# Patient Record
Sex: Male | Born: 1980 | Race: White | Hispanic: No | Smoking: Never smoker
Health system: Southern US, Community
[De-identification: ages and names within clinical notes are randomized; demographics above are authoritative.]

## PROBLEM LIST (undated history)

## (undated) DIAGNOSIS — I10 Essential (primary) hypertension: Secondary | ICD-10-CM

## (undated) DIAGNOSIS — I639 Cerebral infarction, unspecified: Secondary | ICD-10-CM

## (undated) DIAGNOSIS — G8929 Other chronic pain: Secondary | ICD-10-CM

## (undated) DIAGNOSIS — M549 Dorsalgia, unspecified: Secondary | ICD-10-CM

## (undated) DIAGNOSIS — M109 Gout, unspecified: Secondary | ICD-10-CM

## (undated) DIAGNOSIS — F419 Anxiety disorder, unspecified: Secondary | ICD-10-CM

## (undated) HISTORY — PX: TONSILLECTOMY: SUR1361

## (undated) HISTORY — DX: Anxiety disorder, unspecified: F41.9

---
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
## 2001-12-09 ENCOUNTER — Encounter: Payer: Self-pay | Admitting: Emergency Medicine

## 2001-12-09 ENCOUNTER — Emergency Department (HOSPITAL_COMMUNITY): Admission: EM | Admit: 2001-12-09 | Discharge: 2001-12-09 | Payer: Self-pay | Admitting: Emergency Medicine

## 2002-08-08 ENCOUNTER — Emergency Department (HOSPITAL_COMMUNITY): Admission: EM | Admit: 2002-08-08 | Discharge: 2002-08-08 | Payer: Self-pay | Admitting: Emergency Medicine

## 2002-11-18 ENCOUNTER — Emergency Department (HOSPITAL_COMMUNITY): Admission: EM | Admit: 2002-11-18 | Discharge: 2002-11-18 | Payer: Self-pay | Admitting: Emergency Medicine

## 2003-06-30 ENCOUNTER — Emergency Department (HOSPITAL_COMMUNITY): Admission: EM | Admit: 2003-06-30 | Discharge: 2003-06-30 | Payer: Self-pay | Admitting: Family Medicine

## 2003-07-31 ENCOUNTER — Emergency Department (HOSPITAL_COMMUNITY): Admission: EM | Admit: 2003-07-31 | Discharge: 2003-07-31 | Payer: Self-pay | Admitting: Emergency Medicine

## 2003-08-13 ENCOUNTER — Emergency Department (HOSPITAL_COMMUNITY): Admission: EM | Admit: 2003-08-13 | Discharge: 2003-08-13 | Payer: Self-pay | Admitting: Emergency Medicine

## 2003-10-07 ENCOUNTER — Emergency Department (HOSPITAL_COMMUNITY): Admission: EM | Admit: 2003-10-07 | Discharge: 2003-10-07 | Payer: Self-pay | Admitting: *Deleted

## 2004-02-15 ENCOUNTER — Emergency Department (HOSPITAL_COMMUNITY): Admission: EM | Admit: 2004-02-15 | Discharge: 2004-02-15 | Payer: Self-pay | Admitting: Emergency Medicine

## 2005-01-28 ENCOUNTER — Emergency Department (HOSPITAL_COMMUNITY): Admission: EM | Admit: 2005-01-28 | Discharge: 2005-01-28 | Payer: Self-pay | Admitting: Family Medicine

## 2005-07-15 ENCOUNTER — Emergency Department (HOSPITAL_COMMUNITY): Admission: EM | Admit: 2005-07-15 | Discharge: 2005-07-15 | Payer: Self-pay | Admitting: Emergency Medicine

## 2005-07-28 ENCOUNTER — Emergency Department (HOSPITAL_COMMUNITY): Admission: EM | Admit: 2005-07-28 | Discharge: 2005-07-28 | Payer: Self-pay | Admitting: Family Medicine

## 2006-03-03 ENCOUNTER — Emergency Department (HOSPITAL_COMMUNITY): Admission: EM | Admit: 2006-03-03 | Discharge: 2006-03-03 | Payer: Self-pay | Admitting: Emergency Medicine

## 2006-03-04 ENCOUNTER — Emergency Department (HOSPITAL_COMMUNITY): Admission: EM | Admit: 2006-03-04 | Discharge: 2006-03-04 | Payer: Self-pay | Admitting: Emergency Medicine

## 2006-06-07 ENCOUNTER — Emergency Department (HOSPITAL_COMMUNITY): Admission: EM | Admit: 2006-06-07 | Discharge: 2006-06-07 | Payer: Self-pay | Admitting: Family Medicine

## 2006-06-29 ENCOUNTER — Emergency Department (HOSPITAL_COMMUNITY): Admission: EM | Admit: 2006-06-29 | Discharge: 2006-06-29 | Payer: Self-pay | Admitting: Family Medicine

## 2006-07-30 ENCOUNTER — Emergency Department (HOSPITAL_COMMUNITY): Admission: EM | Admit: 2006-07-30 | Discharge: 2006-07-30 | Payer: Self-pay | Admitting: Emergency Medicine

## 2007-05-15 ENCOUNTER — Emergency Department (HOSPITAL_COMMUNITY): Admission: EM | Admit: 2007-05-15 | Discharge: 2007-05-16 | Payer: Self-pay | Admitting: Emergency Medicine

## 2008-08-17 ENCOUNTER — Emergency Department (HOSPITAL_BASED_OUTPATIENT_CLINIC_OR_DEPARTMENT_OTHER): Admission: EM | Admit: 2008-08-17 | Discharge: 2008-08-18 | Payer: Self-pay | Admitting: Emergency Medicine

## 2008-10-08 ENCOUNTER — Emergency Department (HOSPITAL_COMMUNITY): Admission: EM | Admit: 2008-10-08 | Discharge: 2008-10-08 | Payer: Self-pay | Admitting: Emergency Medicine

## 2008-12-30 ENCOUNTER — Ambulatory Visit: Payer: Self-pay | Admitting: Diagnostic Radiology

## 2008-12-30 ENCOUNTER — Emergency Department (HOSPITAL_BASED_OUTPATIENT_CLINIC_OR_DEPARTMENT_OTHER): Admission: EM | Admit: 2008-12-30 | Discharge: 2008-12-30 | Payer: Self-pay | Admitting: Emergency Medicine

## 2008-12-31 ENCOUNTER — Emergency Department (HOSPITAL_BASED_OUTPATIENT_CLINIC_OR_DEPARTMENT_OTHER): Admission: EM | Admit: 2008-12-31 | Discharge: 2008-12-31 | Payer: Self-pay | Admitting: Emergency Medicine

## 2009-04-03 ENCOUNTER — Other Ambulatory Visit: Payer: Self-pay | Admitting: Emergency Medicine

## 2009-04-04 ENCOUNTER — Other Ambulatory Visit: Payer: Self-pay | Admitting: Emergency Medicine

## 2009-04-04 ENCOUNTER — Inpatient Hospital Stay (HOSPITAL_COMMUNITY): Admission: AD | Admit: 2009-04-04 | Discharge: 2009-04-08 | Payer: Self-pay | Admitting: Psychiatry

## 2009-04-05 ENCOUNTER — Ambulatory Visit: Payer: Self-pay | Admitting: Psychiatry

## 2009-06-18 ENCOUNTER — Emergency Department (HOSPITAL_BASED_OUTPATIENT_CLINIC_OR_DEPARTMENT_OTHER): Admission: EM | Admit: 2009-06-18 | Discharge: 2009-06-19 | Payer: Self-pay | Admitting: Emergency Medicine

## 2009-06-19 ENCOUNTER — Ambulatory Visit: Payer: Self-pay | Admitting: Diagnostic Radiology

## 2009-07-28 ENCOUNTER — Emergency Department (HOSPITAL_BASED_OUTPATIENT_CLINIC_OR_DEPARTMENT_OTHER): Admission: EM | Admit: 2009-07-28 | Discharge: 2009-07-28 | Payer: Self-pay | Admitting: Emergency Medicine

## 2010-02-16 ENCOUNTER — Encounter: Admit: 2010-02-16 | Payer: Self-pay | Admitting: Family Medicine

## 2010-05-03 LAB — BASIC METABOLIC PANEL
CO2: 23 mEq/L (ref 19–32)
Calcium: 8.7 mg/dL (ref 8.4–10.5)
Chloride: 110 mEq/L (ref 96–112)
Glucose, Bld: 101 mg/dL — ABNORMAL HIGH (ref 70–99)
Potassium: 4.3 mEq/L (ref 3.5–5.1)
Sodium: 144 mEq/L (ref 135–145)

## 2010-05-05 LAB — DIFFERENTIAL
Eosinophils Absolute: 0.3 10*3/uL (ref 0.0–0.7)
Eosinophils Relative: 2 % (ref 0–5)
Lymphocytes Relative: 28 % (ref 12–46)
Lymphs Abs: 3.6 10*3/uL (ref 0.7–4.0)
Monocytes Relative: 6 % (ref 3–12)

## 2010-05-05 LAB — CBC
HCT: 45.3 % (ref 39.0–52.0)
Hemoglobin: 15.5 g/dL (ref 13.0–17.0)
MCV: 85.2 fL (ref 78.0–100.0)
RBC: 5.32 MIL/uL (ref 4.22–5.81)
WBC: 12.6 10*3/uL — ABNORMAL HIGH (ref 4.0–10.5)

## 2010-05-05 LAB — BASIC METABOLIC PANEL
Chloride: 103 mEq/L (ref 96–112)
GFR calc Af Amer: >60 mL/min (ref >60)
GFR calc non Af Amer: >60 mL/min (ref >60)
Potassium: 3.7 mEq/L (ref 3.5–5.1)
Sodium: 142 mEq/L (ref 135–145)

## 2010-05-05 LAB — POCT TOXICOLOGY PANEL: Benzodiazepines: POSITIVE

## 2010-05-05 LAB — ETHANOL: Alcohol, Ethyl (B): <5 mg/dL (ref 0–10)

## 2010-05-05 LAB — ACETAMINOPHEN LEVEL: Acetaminophen (Tylenol), Serum: <10 ug/mL — ABNORMAL LOW (ref 10–30)

## 2010-05-19 LAB — DIFFERENTIAL
Eosinophils Absolute: 0.1 10*3/uL (ref 0.0–0.7)
Eosinophils Relative: 1 % (ref 0–5)
Lymphocytes Relative: 12 % (ref 12–46)
Lymphs Abs: 1.6 10*3/uL (ref 0.7–4.0)
Monocytes Absolute: 0.9 10*3/uL (ref 0.1–1.0)
Monocytes Relative: 6 % (ref 3–12)

## 2010-05-19 LAB — URINE MICROSCOPIC-ADD ON

## 2010-05-19 LAB — URINALYSIS, ROUTINE W REFLEX MICROSCOPIC
Bilirubin Urine: NEGATIVE
Glucose, UA: NEGATIVE mg/dL
Ketones, ur: NEGATIVE mg/dL
Nitrite: NEGATIVE
Protein, ur: 30 mg/dL — AB
pH: 5.5 (ref 5.0–8.0)

## 2010-05-19 LAB — BASIC METABOLIC PANEL
Chloride: 106 mEq/L (ref 96–112)
GFR calc non Af Amer: >60 mL/min (ref >60)
Potassium: 4.1 mEq/L (ref 3.5–5.1)
Sodium: 145 mEq/L (ref 135–145)

## 2010-05-19 LAB — CBC
HCT: 44.8 % (ref 39.0–52.0)
Hemoglobin: 15.4 g/dL (ref 13.0–17.0)
MCV: 84.8 fL (ref 78.0–100.0)
WBC: 13.7 10*3/uL — ABNORMAL HIGH (ref 4.0–10.5)

## 2010-11-08 LAB — RPR: RPR Ser Ql: NONREACTIVE

## 2010-11-08 LAB — URINE MICROSCOPIC-ADD ON

## 2010-11-08 LAB — URINALYSIS, ROUTINE W REFLEX MICROSCOPIC
Glucose, UA: NEGATIVE
Nitrite: NEGATIVE
Specific Gravity, Urine: 1.034 — ABNORMAL HIGH
pH: 5

## 2011-10-22 ENCOUNTER — Encounter (HOSPITAL_COMMUNITY): Payer: Self-pay | Admitting: Emergency Medicine

## 2011-10-22 ENCOUNTER — Emergency Department (HOSPITAL_COMMUNITY)
Admission: EM | Admit: 2011-10-22 | Discharge: 2011-10-22 | Disposition: A | Discharge status: Home / Self Care | Payer: Medicaid Other | Attending: Emergency Medicine | Admitting: Emergency Medicine

## 2011-10-22 DIAGNOSIS — J069 Acute upper respiratory infection, unspecified: Secondary | ICD-10-CM | POA: Insufficient documentation

## 2011-10-22 DIAGNOSIS — I10 Essential (primary) hypertension: Secondary | ICD-10-CM | POA: Insufficient documentation

## 2011-10-22 HISTORY — DX: Essential (primary) hypertension: I10

## 2011-10-22 MED ORDER — NAPROXEN 375 MG PO TABS: 375.0000 mg | ORAL_TABLET | Freq: Two times a day (BID) | ORAL | Status: AC | PRN

## 2011-10-22 MED ORDER — FEXOFENADINE-PSEUDOEPHED ER 60-120 MG PO TB12: 1.0000 | ORAL_TABLET | Freq: Two times a day (BID) | ORAL | Status: AC | PRN

## 2011-10-22 NOTE — ED Provider Notes (Signed)
History    31yM with ear pain. B/l with R worse than L. Onset almost a week ago. Feels like getting worse. No fever or chills. Mild sore throat. No difficulty breathing or swallowing. Mild HA and facial pressure. No CP or SOB. No cough or wheezing. Has not tired taking anything for symptoms.   CSN: [id number]  Arrival date & time 10/22/11  1417   First MD Initiated Contact with Patient 10/22/11 1551      Chief Complaint  Patient presents with  . Otalgia    (Consider location/radiation/quality/duration/timing/severity/associated sxs/prior treatment) HPI  Past Medical History  Diagnosis Date  . Hypertension     Past Surgical History  Procedure Date  . Tonsillectomy     No family history on file.  History  Substance Use Topics  . Smoking status: Never Smoker   . Smokeless tobacco: Never Used  . Alcohol Use: No      Review of Systems   Review of symptoms negative unless otherwise noted in HPI.   Allergies  Review of patient's allergies indicates no known allergies.  Home Medications  No current outpatient prescriptions on file.  BP 161/97  Pulse 93  Temp 98.1 F (36.7 C) (Oral)  Resp 24  SpO2 100%  Physical Exam  Nursing note and vitals reviewed. Constitutional: He appears well-developed and well-nourished. No distress.  HENT:  Head: Normocephalic and atraumatic.  Right Ear: External ear normal.  Left Ear: External ear normal.  Mouth/Throat: Oropharynx is clear and moist. No oropharyngeal exudate.       Tympanic membranes are clear no bulging. No effusion noted. External auditory canals are clear as well. There is no mastoid tenderness. No overlying skin changes. Posterior pharynx is clear. Uvula is midline. Handling secretions. No stridor.  Eyes: Conjunctivae are normal. Pupils are equal, round, and reactive to light. Right eye exhibits no discharge. Left eye exhibits no discharge.  Neck: Normal range of motion. Neck supple.  Cardiovascular: Normal  rate, regular rhythm and normal heart sounds.  Exam reveals no gallop and no friction rub.   No murmur heard. Pulmonary/Chest: Effort normal and breath sounds normal. No stridor. No respiratory distress.  Abdominal: Soft. He exhibits no distension. There is no tenderness.  Musculoskeletal: He exhibits no edema and no tenderness.  Lymphadenopathy:    He has no cervical adenopathy.  Neurological: He is alert.  Skin: Skin is warm and dry.  Psychiatric: He has a normal mood and affect. His behavior is normal. Thought content normal.    ED Course  Procedures (including critical care time)  Labs Reviewed - No data to display No results found.   1. Viral URI       MDM  31yM with b/l ear pain, headache and congestion. Physical exam fairly unremarkable. No evidence of serious bacterial illness. Plan symptomatic tx for viral uri or possible sinusitis. No indication for abx. Return precautions discussed. Outpt fu.        Raeford Razor, MD 10/22/11 1610

## 2011-10-22 NOTE — ED Notes (Signed)
Bilateral ear pain onset several days ago, headache onset today. Pt continues to list a medley list of other Sx from other body pain

## 2011-10-25 ENCOUNTER — Emergency Department (HOSPITAL_BASED_OUTPATIENT_CLINIC_OR_DEPARTMENT_OTHER)
Admission: EM | Admit: 2011-10-25 | Discharge: 2011-10-25 | Disposition: A | Discharge status: Home / Self Care | Payer: Medicaid Other | Attending: Emergency Medicine | Admitting: Emergency Medicine

## 2011-10-25 ENCOUNTER — Emergency Department (HOSPITAL_BASED_OUTPATIENT_CLINIC_OR_DEPARTMENT_OTHER): Payer: Medicaid Other

## 2011-10-25 ENCOUNTER — Encounter (HOSPITAL_BASED_OUTPATIENT_CLINIC_OR_DEPARTMENT_OTHER): Payer: Self-pay | Admitting: *Deleted

## 2011-10-25 DIAGNOSIS — S6710XA Crushing injury of unspecified finger(s), initial encounter: Secondary | ICD-10-CM

## 2011-10-25 DIAGNOSIS — I10 Essential (primary) hypertension: Secondary | ICD-10-CM | POA: Insufficient documentation

## 2011-10-25 DIAGNOSIS — W230XXA Caught, crushed, jammed, or pinched between moving objects, initial encounter: Secondary | ICD-10-CM | POA: Insufficient documentation

## 2011-10-25 DIAGNOSIS — S62639A Displaced fracture of distal phalanx of unspecified finger, initial encounter for closed fracture: Secondary | ICD-10-CM

## 2011-10-25 MED ORDER — CEPHALEXIN 500 MG PO CAPS: 500.0000 mg | ORAL_CAPSULE | Freq: Three times a day (TID) | ORAL | Status: AC

## 2011-10-25 MED ORDER — HYDROCODONE-ACETAMINOPHEN 5-325 MG PO TABS
2.0000 | ORAL_TABLET | Freq: Once | ORAL | Status: AC
  Administered 2011-10-25: 2 via ORAL
  Filled 2011-10-25: qty 2

## 2011-10-25 MED ORDER — HYDROCODONE-ACETAMINOPHEN 5-325 MG PO TABS: ORAL_TABLET | ORAL | Status: AC

## 2011-10-25 NOTE — ED Notes (Signed)
Pt c/o right index and 3 rd finger injury x 1 day ago

## 2011-10-25 NOTE — ED Provider Notes (Signed)
History     CSN: [id number]  Arrival date & time 10/25/11  0006   First MD Initiated Contact with Patient 10/25/11 0210      Chief Complaint  Patient presents with  . Finger Injury    (Consider location/radiation/quality/duration/timing/severity/associated sxs/prior treatment) HPI Comments: Pt reports got hand injured in a fan that had its guard open.  Injured tips of third and second fingers, but bleeding from nail of third finger.  Difficulty bending at tip due to pain and swelling.  No numbness or weakness.  Right handed.  No fevers.  Bleeding has stopped.    Patient is a 31 y.o. male presenting with hand pain. The history is provided by the patient and a relative.  Hand Pain The current episode started 12 to 24 hours ago. The problem occurs constantly. The problem has been gradually worsening. The symptoms are aggravated by bending. Nothing relieves the symptoms. He has tried nothing for the symptoms.    Past Medical History  Diagnosis Date  . Hypertension     Past Surgical History  Procedure Date  . Tonsillectomy     History reviewed. No pertinent family history.  History  Substance Use Topics  . Smoking status: Never Smoker   . Smokeless tobacco: Never Used  . Alcohol Use: No      Review of Systems  Constitutional: Negative.   Musculoskeletal: Positive for joint swelling and arthralgias.  Skin: Positive for wound. Negative for color change and pallor.  Neurological: Negative for weakness and numbness.  Hematological: Does not bruise/bleed easily.    Allergies  Review of patient's allergies indicates no known allergies.  Home Medications   Current Outpatient Rx  Name Route Sig Dispense Refill  . CEPHALEXIN 500 MG PO CAPS Oral Take 1 capsule (500 mg total) by mouth 3 (three) times daily. 30 capsule 0  . FEXOFENADINE-PSEUDOEPHED ER 60-120 MG PO TB12 Oral Take 1 tablet by mouth 2 (two) times daily as needed. 20 tablet 0  . HYDROCODONE-ACETAMINOPHEN 5-325  MG PO TABS  1-2 tablets po q 6 hours prn moderate to severe pain 20 tablet 0  . NAPROXEN 375 MG PO TABS Oral Take 1 tablet (375 mg total) by mouth 2 (two) times daily as needed. 20 tablet 0    BP 128/89  Pulse 78  Temp 99 F (37.2 C) (Oral)  Resp 16  Ht 5\' 7"  (1.702 m)  Wt 360 lb (163.295 kg)  BMI 56.38 kg/m2  SpO2 100%  Physical Exam  Nursing note and vitals reviewed. Constitutional: He appears well-developed and well-nourished.  HENT:  Head: Normocephalic and atraumatic.  Cardiovascular: Intact distal pulses.   Musculoskeletal:       Right hand: He exhibits decreased range of motion and tenderness.       Hands:      Third finger, pt cannot flex at distal PIP on right  Neurological: He is alert. He has normal strength. Coordination normal.  Skin: Skin is warm and dry. No rash noted. No pallor.  Psychiatric: He has a normal mood and affect.    ED Course  Procedures (including critical care time)  Labs Reviewed - No data to display Dg Hand Complete Right  10/25/2011  *RADIOLOGY REPORT*  Clinical Data: Pain, bruising, and bleeding of the right middle finger after injury.  RIGHT HAND - COMPLETE 3+ VIEW  Comparison: None.  Findings: Crush type fracture of the distal phalangeal tuft of the right third finger with mild displacement fracture fragment dorsally.  Soft  tissue swelling.  No soft tissue gas demonstrated. Bones appear otherwise intact.  IMPRESSION: Fracture the distal phalangeal tuft of the right third finger.   Original Report Authenticated By: Marlon Pel, M.D.      1. Phalanx, distal fracture of finger   2. Crush injury to finger       MDM  Pt with bleeding from beneath fingernail, some limitation in flexion of distal phalanx due to pain and injury.  Cleaned, put on abx, finger splint, refer to hand surgeon.  Mild contusion to second finger, but FROM.          Gavin Pound. Oletta Lamas, MD 10/25/11 1610

## 2011-10-25 NOTE — Discharge Instructions (Signed)
 Crush Injury, Fingers or Toes A crush injury to the fingers or toes means the tissues have been damaged by being squeezed (compressed). There will be bleeding into the tissues and swelling. Often, blood will collect under the skin. When this happens, the skin on the finger often dies and may slough off (shed) 1 week to 10 days later. Usually, new skin is growing underneath. If the injury has been too severe and the tissue does not survive, the damaged tissue may begin to turn black over several days.  Wounds which occur because of the crushing may be stitched (sutured) shut. However, crush injuries are more likely to become infected than other injuries.These wounds may not be closed as tightly as other types of cuts to prevent infection. Nails involved are often lost. These usually grow back over several weeks.  DIAGNOSIS X-rays may be taken to see if there is any injury to the bones. TREATMENT Broken bones (fractures) may be treated with splinting, depending on the fracture. Often, no treatment is required for fractures of the last bone in the fingers or toes. HOME CARE INSTRUCTIONS   The crushed part should be raised (elevated) above the heart or center of the chest as much as possible for the first several days or as directed. This helps with pain and lessens swelling. Less swelling increases the chances that the crushed part will survive.   Put ice on the injured area.   Put ice in a plastic bag.   Place a towel between your skin and the bag.   Leave the ice on for 15 to 20 minutes, 3 to 4 times a day for the first 2 days.   Only take over-the-counter or prescription medicines for pain, discomfort, or fever as directed by your caregiver.   Use your injured part only as directed.   Change your bandages (dressings) as directed.   Keep all follow-up appointments as directed by your caregiver. Not keeping your appointment could result in a chronic or permanent injury, pain, and disability.  If there is any problem keeping the appointment, you must call to reschedule.  SEEK IMMEDIATE MEDICAL CARE IF:   There is redness, swelling, or increasing pain in the wound area.   Pus is coming from the wound.   You have a fever.   You notice a bad smell coming from the wound or dressing.   The edges of the wound do not stay together after the sutures have been removed.   You are unable to move the injured finger or toe.  MAKE SURE YOU:   Understand these instructions.   Will watch your condition.   Will get help right away if you are not doing well or get worse.  Document Released: 01/31/2005 Document Revised: 01/20/2011 Document Reviewed: 06/18/2010 [facility] Patient Information 2012 [place], [place].    Narcotic and benzodiazepine use may cause drowsiness, slowed breathing or dependence.  Please use with caution and do not drive, operate machinery or watch young children alone while taking them.  Taking combinations of these medications or drinking alcohol will potentiate these effects.

## 2011-10-25 NOTE — ED Notes (Addendum)
MD at bedside. 

## 2012-05-13 ENCOUNTER — Emergency Department (HOSPITAL_BASED_OUTPATIENT_CLINIC_OR_DEPARTMENT_OTHER)
Admission: EM | Admit: 2012-05-13 | Discharge: 2012-05-13 | Disposition: A | Discharge status: Home / Self Care | Payer: Medicaid Other | Attending: Emergency Medicine | Admitting: Emergency Medicine

## 2012-05-13 ENCOUNTER — Emergency Department (HOSPITAL_BASED_OUTPATIENT_CLINIC_OR_DEPARTMENT_OTHER): Payer: Medicaid Other

## 2012-05-13 ENCOUNTER — Encounter (HOSPITAL_BASED_OUTPATIENT_CLINIC_OR_DEPARTMENT_OTHER): Payer: Self-pay | Admitting: *Deleted

## 2012-05-13 DIAGNOSIS — Z862 Personal history of diseases of the blood and blood-forming organs and certain disorders involving the immune mechanism: Secondary | ICD-10-CM | POA: Insufficient documentation

## 2012-05-13 DIAGNOSIS — Z8739 Personal history of other diseases of the musculoskeletal system and connective tissue: Secondary | ICD-10-CM

## 2012-05-13 DIAGNOSIS — I10 Essential (primary) hypertension: Secondary | ICD-10-CM | POA: Insufficient documentation

## 2012-05-13 DIAGNOSIS — M25571 Pain in right ankle and joints of right foot: Secondary | ICD-10-CM

## 2012-05-13 DIAGNOSIS — S82891A Other fracture of right lower leg, initial encounter for closed fracture: Secondary | ICD-10-CM

## 2012-05-13 DIAGNOSIS — S92109A Unspecified fracture of unspecified talus, initial encounter for closed fracture: Secondary | ICD-10-CM | POA: Insufficient documentation

## 2012-05-13 DIAGNOSIS — W010XXA Fall on same level from slipping, tripping and stumbling without subsequent striking against object, initial encounter: Secondary | ICD-10-CM | POA: Insufficient documentation

## 2012-05-13 DIAGNOSIS — Y92009 Unspecified place in unspecified non-institutional (private) residence as the place of occurrence of the external cause: Secondary | ICD-10-CM | POA: Insufficient documentation

## 2012-05-13 DIAGNOSIS — Z8639 Personal history of other endocrine, nutritional and metabolic disease: Secondary | ICD-10-CM | POA: Insufficient documentation

## 2012-05-13 DIAGNOSIS — Y9389 Activity, other specified: Secondary | ICD-10-CM | POA: Insufficient documentation

## 2012-05-13 MED ORDER — HYDROCODONE-ACETAMINOPHEN 5-325 MG PO TABS
1.0000 | ORAL_TABLET | Freq: Once | ORAL | Status: AC
  Administered 2012-05-13: 1 via ORAL
  Filled 2012-05-13: qty 1

## 2012-05-13 MED ORDER — HYDROCODONE-ACETAMINOPHEN 5-325 MG PO TABS: 1.0000 | ORAL_TABLET | ORAL | Status: DC | PRN

## 2012-05-13 NOTE — ED Notes (Signed)
Patient transported to X-ray 

## 2012-05-13 NOTE — ED Provider Notes (Signed)
History     CSN: [id number]  Arrival date & time 05/13/12  0272   First MD Initiated Contact with Patient 05/13/12 1850      Chief Complaint  Patient presents with  . Ankle Injury    (Consider location/radiation/quality/duration/timing/severity/associated sxs/prior treatment) HPI Comments: Patient is a 32 y/o M with PMHx gout presenting to the ED with right ankle pain. Patient reported that pain started yesterday shortly after tripping over brushes in front of a yard, patient reported that when he landed he landed on his right foot in an inverted manner. Patient reported that right ankle pain has become increasingly worse, patient is unable to specify pain - reported that pain is severe. Patient reported radiation to the right foot, but no radiation to right leg. Pain is worse with pressure and weight bearing. Patient reported using heat with little relief. Patient reported moderate swelling with no sign of ecchymosis. Patient denied numbness, tingling, inflammation, bruising, lesions, changes to vision, headaches, dizziness, abdominal pain, neck pain, shortness of breathe, difficulty breathing, chest pain, fever, nausea, vomiting.  Patient is a 32 y.o. male presenting with ankle pain. The history is provided by the patient. No language interpreter was used.  Ankle Pain Location:  Ankle Time since incident:  1 day Injury: yes   Ankle location:  R ankle Pain details:    Quality:  Unable to specify (patient reported that it is just painful.)   Radiates to: Right foot.   Severity:  Severe   Onset quality:  Sudden   Duration:  1 day   Timing:  Constant   Progression:  Worsening Chronicity:  New Dislocation: no   Foreign body present:  No foreign bodies Prior injury to area:  No Relieved by:  Nothing Worsened by:  Bearing weight and activity Ineffective treatments:  Heat and rest Associated symptoms: decreased ROM and swelling   Associated symptoms: no back pain, no fatigue, no  fever, no muscle weakness, no neck pain, no numbness and no tingling   Risk factors: obesity   Risk factors: no recent illness     Past Medical History  Diagnosis Date  . Hypertension     Past Surgical History  Procedure Laterality Date  . Tonsillectomy      History reviewed. No pertinent family history.  History  Substance Use Topics  . Smoking status: Never Smoker   . Smokeless tobacco: Never Used  . Alcohol Use: No      Review of Systems  Constitutional: Negative for fever, chills and fatigue.  HENT: Negative for ear pain and neck pain.   Eyes: Negative for pain.  Respiratory: Negative for chest tightness and shortness of breath.   Cardiovascular: Negative for chest pain and leg swelling.  Genitourinary: Negative for dysuria.  Musculoskeletal: Positive for joint swelling and arthralgias. Negative for back pain.       Right ankle pain - due to injury.  All other systems reviewed and are negative.    Allergies  Review of patient's allergies indicates no known allergies.  Home Medications   Current Outpatient Rx  Name  Route  Sig  Dispense  Refill  . fexofenadine-pseudoephedrine (ALLEGRA-D) 60-120 MG per tablet   Oral   Take 1 tablet by mouth 2 (two) times daily as needed.   20 tablet   0   . HYDROcodone-acetaminophen (NORCO) 5-325 MG per tablet   Oral   Take 1 tablet by mouth every 4 (four) hours as needed for pain.   6 tablet  0   . naproxen (NAPROSYN) 375 MG tablet   Oral   Take 1 tablet (375 mg total) by mouth 2 (two) times daily as needed.   20 tablet   0     BP 146/96  Pulse 82  Temp(Src) 98.4 F (36.9 C) (Oral)  Resp 20  Ht 5\' 9"  (1.753 m)  Wt 395 lb (179.171 kg)  BMI 58.3 kg/m2  SpO2 98%  Physical Exam  Nursing note and vitals reviewed. Constitutional: He is oriented to person, place, and time. He appears well-developed and well-nourished. No distress.  HENT:  Head: Normocephalic and atraumatic.  Eyes: Conjunctivae and EOM are  normal. Pupils are equal, round, and reactive to light. Right eye exhibits no discharge. Left eye exhibits no discharge.  Neck: Normal range of motion. Neck supple. No thyromegaly present.  Cardiovascular: Normal rate, regular rhythm, normal heart sounds and intact distal pulses.  Exam reveals no friction rub.   No murmur heard. Pulmonary/Chest: Effort normal and breath sounds normal. No respiratory distress. He has no wheezes. He has no rales.  Musculoskeletal: He exhibits edema and tenderness.  Right ankle: Moderate swelling to lateral aspect of right ankle. Pain upon palpation to right ankle. Decreased ROM due to pain. Pain upon attempt of inversion, eversion, dorsi-flexion, plantar flexion- patient unable to move ankle. Full ROM in toes. Sensation intact. Pulses palpable.   Right knee full ROM.   Left lower extremity: Full ROM, sensation intact, pulses palpable.   Lymphadenopathy:    He has no cervical adenopathy.  Neurological: He is alert and oriented to person, place, and time. No cranial nerve deficit. He exhibits normal muscle tone. Coordination normal.  Skin: Skin is warm and dry. No rash noted. He is not diaphoretic. No erythema.    ED Course  Procedures (including critical care time)  Labs Reviewed - No data to display Dg Ankle Complete Right  05/13/2012  *RADIOLOGY REPORT*  Clinical Data: Injury  RIGHT ANKLE - COMPLETE 3+ VIEW  Comparison: None.  Findings: Soft tissue swelling about the ankle is moderate. Minimal spurring at the posterior calcaneus. Linear lucency through the posterior aspect of the talus worrisome for avulsion fracture is noted to  IMPRESSION: Tiny avulsion fracture the posterior talus of indeterminate age.   Original Report Authenticated By: Jolaine Click, M.D.      1. Avulsion fracture of ankle, right, closed, initial encounter   2. Ankle pain, right   3. History of gout       MDM  DDx: Fracture Sprain Strain  I personally evaluated and examined  the patient. Right ankle xray identified avulsion fracture of the posterior talus. Patient afebrile, normotensive, non-tachycardic, alert and calm affect. Patient aseptic and non-toxic appearing. No neurovascular damage noted. Patient unable to ambulate due to pain with weight bearing. Patient placed in cam walker boot and given crutches. Discharged patient with pain medications - discussed with patient that while on pain medications to not drive, drink alcohol, use drugs, or operate any heavy machinery. Patient was instructed to elevate ankle, prop up with pillows, and ice as often as possible. Discussed with patient the importance of keeping on cam walker boot and using crutches. Discussed with patient to follow-up with Dr. Sherlean Foot, orthopedics. Discussed with patient that if symptoms are to worsen to report back to the ED. Patient agreed to plan of care, understood, all questions answered.         Raymon Mutton, PA-C 05/13/12 2338

## 2012-05-13 NOTE — ED Notes (Signed)
Pt states he injured his right ankle last p.m. PMS intact

## 2012-05-15 NOTE — ED Provider Notes (Signed)
Medical screening examination/treatment/procedure(s) were performed by non-physician practitioner and as supervising physician I was immediately available for consultation/collaboration.  Gilda Crease, MD 05/15/12 [phone]

## 2012-05-17 DIAGNOSIS — M25579 Pain in unspecified ankle and joints of unspecified foot: Secondary | ICD-10-CM | POA: Insufficient documentation

## 2012-06-04 ENCOUNTER — Ambulatory Visit: Payer: Medicaid Other | Attending: Orthopedic Surgery | Admitting: Physical Therapy

## 2012-07-23 ENCOUNTER — Ambulatory Visit: Payer: Medicaid Other | Admitting: *Deleted

## 2013-03-18 ENCOUNTER — Emergency Department (HOSPITAL_COMMUNITY)
Admission: EM | Admit: 2013-03-18 | Discharge: 2013-03-18 | Disposition: A | Discharge status: Home / Self Care | Payer: No Typology Code available for payment source | Attending: Emergency Medicine | Admitting: Emergency Medicine

## 2013-03-18 ENCOUNTER — Encounter (HOSPITAL_COMMUNITY): Payer: Self-pay | Admitting: Emergency Medicine

## 2013-03-18 ENCOUNTER — Emergency Department (HOSPITAL_COMMUNITY): Payer: No Typology Code available for payment source

## 2013-03-18 DIAGNOSIS — Y92410 Unspecified street and highway as the place of occurrence of the external cause: Secondary | ICD-10-CM | POA: Insufficient documentation

## 2013-03-18 DIAGNOSIS — S139XXA Sprain of joints and ligaments of unspecified parts of neck, initial encounter: Secondary | ICD-10-CM | POA: Insufficient documentation

## 2013-03-18 DIAGNOSIS — Y9389 Activity, other specified: Secondary | ICD-10-CM | POA: Insufficient documentation

## 2013-03-18 DIAGNOSIS — I10 Essential (primary) hypertension: Secondary | ICD-10-CM | POA: Insufficient documentation

## 2013-03-18 DIAGNOSIS — S161XXA Strain of muscle, fascia and tendon at neck level, initial encounter: Secondary | ICD-10-CM

## 2013-03-18 DIAGNOSIS — Z79899 Other long term (current) drug therapy: Secondary | ICD-10-CM | POA: Insufficient documentation

## 2013-03-18 MED ORDER — OXYCODONE-ACETAMINOPHEN 5-325 MG PO TABS: 1.0000 | ORAL_TABLET | ORAL | Status: DC | PRN

## 2013-03-18 MED ORDER — OXYCODONE-ACETAMINOPHEN 5-325 MG PO TABS
2.0000 | ORAL_TABLET | Freq: Once | ORAL | Status: AC
  Administered 2013-03-18: 2 via ORAL
  Filled 2013-03-18: qty 2

## 2013-03-18 MED ORDER — METHOCARBAMOL 750 MG PO TABS: 750.0000 mg | ORAL_TABLET | Freq: Four times a day (QID) | ORAL | Status: DC

## 2013-03-18 MED ORDER — DIAZEPAM 5 MG PO TABS
5.0000 mg | ORAL_TABLET | Freq: Once | ORAL | Status: AC
  Administered 2013-03-18: 5 mg via ORAL
  Filled 2013-03-18 (×2): qty 1
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
## 2014-01-02 ENCOUNTER — Emergency Department (HOSPITAL_BASED_OUTPATIENT_CLINIC_OR_DEPARTMENT_OTHER)
Admission: EM | Admit: 2014-01-02 | Discharge: 2014-01-02 | Disposition: A | Discharge status: Home / Self Care | Payer: Medicaid Other | Attending: Emergency Medicine | Admitting: Emergency Medicine

## 2014-01-02 ENCOUNTER — Encounter (HOSPITAL_BASED_OUTPATIENT_CLINIC_OR_DEPARTMENT_OTHER): Payer: Self-pay

## 2014-01-02 DIAGNOSIS — I10 Essential (primary) hypertension: Secondary | ICD-10-CM | POA: Insufficient documentation

## 2014-01-02 DIAGNOSIS — G8929 Other chronic pain: Secondary | ICD-10-CM | POA: Insufficient documentation

## 2014-01-02 DIAGNOSIS — M722 Plantar fascial fibromatosis: Secondary | ICD-10-CM | POA: Diagnosis not present

## 2014-01-02 DIAGNOSIS — E669 Obesity, unspecified: Secondary | ICD-10-CM | POA: Diagnosis not present

## 2014-01-02 DIAGNOSIS — Z79899 Other long term (current) drug therapy: Secondary | ICD-10-CM | POA: Insufficient documentation

## 2014-01-02 DIAGNOSIS — M79672 Pain in left foot: Secondary | ICD-10-CM | POA: Diagnosis present

## 2014-01-02 DIAGNOSIS — M7662 Achilles tendinitis, left leg: Secondary | ICD-10-CM | POA: Insufficient documentation

## 2014-01-02 HISTORY — DX: Other chronic pain: G89.29

## 2014-01-02 HISTORY — DX: Dorsalgia, unspecified: M54.9

## 2014-01-02 HISTORY — DX: Gout, unspecified: M10.9

## 2014-01-02 MED ORDER — OXYCODONE-ACETAMINOPHEN 5-325 MG PO TABS
ORAL_TABLET | ORAL | Status: AC
  Filled 2014-01-02: qty 1

## 2014-01-02 MED ORDER — NAPROXEN 500 MG PO TABS: 500.0000 mg | ORAL_TABLET | Freq: Two times a day (BID) | ORAL | Status: DC

## 2014-01-02 MED ORDER — OXYCODONE-ACETAMINOPHEN 5-325 MG PO TABS
1.0000 | ORAL_TABLET | Freq: Once | ORAL | Status: AC
  Administered 2014-01-02: 1 via ORAL

## 2014-01-02 NOTE — ED Provider Notes (Signed)
CSN: [id number]     Arrival date & time 01/02/14  0123 History   First MD Initiated Contact with Patient 01/02/14 0136     Chief Complaint  Patient presents with  . Foot Pain     (Consider location/radiation/quality/duration/timing/severity/associated sxs/prior Treatment) HPI  This is a 33 year old male with a history of hypertension and gout who presents with left foot pain. Patient reports chronic left foot pain. He has seen his primary doctor and has been diagnosed with Achilles tendinitis. Patient reports that the pain is getting worse. He states that it comes and goes. He states that it flares up approximately every 2 weeks. The pain is over his heel and improves as the day goes on. He is taking Aleve and Percocet at home which does help some.  Current pain is 6 out of 10. He states that sometimes he has to use crutches to walk.  Patient reports that he fell yesterday but does not think he reinjured his foot. He did not hit his head or lose consciousness. Denies history of diabetes.  Past Medical History  Diagnosis Date  . Hypertension   . Chronic back pain   . Gout    Past Surgical History  Procedure Laterality Date  . Tonsillectomy     No family history on file. History  Substance Use Topics  . Smoking status: Never Smoker   . Smokeless tobacco: Never Used  . Alcohol Use: No    Review of Systems  Constitutional: Negative.  Negative for fever.  Respiratory: Negative.   Cardiovascular: Negative.  Negative for chest pain.  Musculoskeletal: Negative for back pain.       Left foot pain  Skin: Negative for wound.  All other systems reviewed and are negative.     Allergies  Review of patient's allergies indicates no known allergies.  Home Medications   Prior to Admission medications   Medication Sig Start Date End Date Taking? Authorizing Provider  methocarbamol (ROBAXIN-750) 750 MG tablet Take 1 tablet (750 mg total) by mouth 4 (four) times daily. 03/18/13   Toy BakerAnthony  T Allen, MD  naproxen (NAPROSYN) 500 MG tablet Take 1 tablet (500 mg total) by mouth 2 (two) times daily. 01/02/14   Shon Batonourtney F Manav Pierotti, MD  oxyCODONE-acetaminophen (PERCOCET/ROXICET) 5-325 MG per tablet Take 1 tablet by mouth every 8 (eight) hours as needed for severe pain.    Historical Provider, MD  phentermine 37.5 MG capsule Take 37.5 mg by mouth 4 (four) times daily.    Historical Provider, MD   BP 150/83 mmHg  Pulse 91  Temp(Src) 98.2 F (36.8 C) (Oral)  Resp 20  Ht 5\' 9"  (1.753 m)  Wt 392 lb (177.81 kg)  BMI 57.86 kg/m2  SpO2 97% Physical Exam  Constitutional: He is oriented to person, place, and time.  Obese  HENT:  Head: Normocephalic and atraumatic.  Cardiovascular: Normal rate and regular rhythm.   Pulmonary/Chest: Effort normal. No respiratory distress.  Musculoskeletal: He exhibits no edema.  Focused examination of the left foot reveals tenderness to palpation over the plantar fascia and left heel, no sores or overlying skin changes noted, good range of motion at the ankle, no obvious deformity, 2+ DP pulse, patient ambulatory and able to bear weight.  Neurological: He is alert and oriented to person, place, and time.  Skin: Skin is warm and dry.  Psychiatric: He has a normal mood and affect.  Nursing note and vitals reviewed.   ED Course  Procedures (including critical care time)  Labs Review Labs Reviewed - No data to display  Imaging Review No results found.   EKG Interpretation None      MDM   Final diagnoses:  Plantar fasciitis of left foot  Achilles tendinitis of left lower extremity    Patient presents with chronic left foot and heel pain. Nontoxic on exam. No wounds or skin changes noted on exam. Given the pain improves as the day goes on and he has tenderness over his plantar fascia, suspect some element of plantar fasciitis.  Discussed with patient continuing anti-inflammatories and pain medication. He was instructed to stretch his plantar fascia  in the more to help with pain. Given chronicity of pain, will give him follow-up with sports medicine, Dr. Pearletha ForgeHudnall. Patient stated understanding. At this time and do not feel there is any indication for imaging. Patient is ambulatory without difficulty at discharge.  After history, exam, and medical workup I feel the patient has been appropriately medically screened and is safe for discharge home. Pertinent diagnoses were discussed with the patient. Patient was given return precautions.     Shon Batonourtney F Abdulkareem Badolato, MD 01/02/14 [phone]

## 2014-01-02 NOTE — ED Notes (Signed)
Pt reports been having left foot pain for "awhile now".  Reports was told in the past he has something wrong with his achilles tendon.  Pt reports in the am he can hardly walk he has to use crutches. Wife reports feel yesterday and she had to get off work to help him get out of the floor.  Pt reports severe pain to left heel.

## 2014-01-02 NOTE — Discharge Instructions (Signed)
Achilles Tendinitis Achilles tendinitis is inflammation of the tough, cord-like band that attaches the lower muscles of your leg to your heel (Achilles tendon). It is usually caused by overusing the tendon and joint involved.  CAUSES Achilles tendinitis can happen because of:  A sudden increase in exercise or activity (such as running).  Doing the same exercises or activities (such as jumping) over and over.  Not warming up calf muscles before exercising.  Exercising in shoes that are worn out or not made for exercise.  Having arthritis or a bone growth on the back of the heel bone. This can rub against the tendon and hurt the tendon. SIGNS AND SYMPTOMS The most common symptoms are:  Pain in the back of the leg, just above the heel. The pain usually gets worse with exercise and better with rest.  Stiffness or soreness in the back of the leg, especially in the morning.  Swelling of the skin over the Achilles tendon.  Trouble standing on tiptoe. Sometimes, an Achilles tendon tears (ruptures). Symptoms of an Achilles tendon rupture can include:  Sudden, severe pain in the back of the leg.  Trouble putting weight on the foot or walking normally. DIAGNOSIS Achilles tendinitis will be diagnosed based on symptoms and a physical examination. An X-ray may be done to check if another condition is causing your symptoms. An MRI may be ordered if your health care provider suspects you may have completely torn your tendon, which is called an Achilles tendon rupture.  TREATMENT  Achilles tendinitis usually gets better over time. It can take weeks to months to heal completely. Treatment focuses on treating the symptoms and helping the injury heal. HOME CARE INSTRUCTIONS   Rest your Achilles tendon and avoid activities that cause pain.  Apply ice to the injured area:  Put ice in a plastic bag.  Place a towel between your skin and the bag.  Leave the ice on for 20 minutes, 2-3 times a  day  Try to avoid using the tendon (other than gentle range of motion) while the tendon is painful. Do not resume use until instructed by your health care provider. Then begin use gradually. Do not increase use to the point of pain. If pain does develop, decrease use and continue the above measures. Gradually increase activities that do not cause discomfort until you achieve normal use.  Do exercises to make your calf muscles stronger and more flexible. Your health care provider or physical therapist can recommend exercises for you to do.  Wrap your ankle with an elastic bandage or other wrap. This can help keep your tendon from moving too much. Your health care provider will show you how to wrap your ankle correctly.  Only take over-the-counter or prescription medicines for pain, discomfort, or fever as directed by your health care provider. SEEK MEDICAL CARE IF:   Your pain and swelling increase or pain is uncontrolled with medicines.  You develop new, unexplained symptoms or your symptoms get worse.  You are unable to move your toes or foot.  You develop warmth and swelling in your foot.  You have an unexplained temperature. MAKE SURE YOU:   Understand these instructions.  Will watch your condition.  Will get help right away if you are not doing well or get worse. Document Released: 11/10/2004 Document Revised: 11/21/2012 Document Reviewed: 09/12/2012 Exit[employer] Patient Information 2015 [employer]. This information is not intended to replace advice given to you by your health care provider. Make sure you discuss   any questions you have with your health care provider. Plantar Fasciitis Plantar fasciitis is a common condition that causes foot pain. It is soreness (inflammation) of the band of tough fibrous tissue on the bottom of the foot that runs from the heel bone (calcaneus) to the ball of the foot. The cause of this soreness may be from excessive standing, poor fitting shoes,  running on hard surfaces, being overweight, having an abnormal walk, or overuse (this is common in runners) of the painful foot or feet. It is also common in aerobic exercise dancers and ballet dancers. SYMPTOMS  Most people with plantar fasciitis complain of:  Severe pain in the morning on the bottom of their foot especially when taking the first steps out of bed. This pain recedes after a few minutes of walking.  Severe pain is experienced also during walking following a long period of inactivity.  Pain is worse when walking barefoot or up stairs DIAGNOSIS   Your caregiver will diagnose this condition by examining and feeling your foot.  Special tests such as X-rays of your foot, are usually not needed. PREVENTION   Consult a sports medicine professional before beginning a new exercise program.  Walking programs offer a good workout. With walking there is a lower chance of overuse injuries common to runners. There is less impact and less jarring of the joints.  Begin all new exercise programs slowly. If problems or pain develop, decrease the amount of time or distance until you are at a comfortable level.  Wear good shoes and replace them regularly.  Stretch your foot and the heel cords at the back of the ankle (Achilles tendon) both before and after exercise.  Run or exercise on even surfaces that are not hard. For example, asphalt is better than pavement.  Do not run barefoot on hard surfaces.  If using a treadmill, vary the incline.  Do not continue to workout if you have foot or joint problems. Seek professional help if they do not improve. HOME CARE INSTRUCTIONS   Avoid activities that cause you pain until you recover.  Use ice or cold packs on the problem or painful areas after working out.  Only take over-the-counter or prescription medicines for pain, discomfort, or fever as directed by your caregiver.  Soft shoe inserts or athletic shoes with air or gel sole  cushions may be helpful.  If problems continue or become more severe, consult a sports medicine caregiver or your own health care provider. Cortisone is a potent anti-inflammatory medication that may be injected into the painful area. You can discuss this treatment with your caregiver. MAKE SURE YOU:   Understand these instructions.  Will watch your condition.  Will get help right away if you are not doing well or get worse. Document Released: 10/26/2000 Document Revised: 04/25/2011 Document Reviewed: 12/26/2007 Exit[employer] Patient Information 2015 [employer]. This information is not intended to replace advice given to you by your health care provider. Make sure you discuss any questions you have with your health care provider.  

## 2014-03-06 ENCOUNTER — Emergency Department (HOSPITAL_BASED_OUTPATIENT_CLINIC_OR_DEPARTMENT_OTHER)
Admission: EM | Admit: 2014-03-06 | Discharge: 2014-03-06 | Disposition: A | Discharge status: Home / Self Care | Payer: Medicaid Other | Attending: Emergency Medicine | Admitting: Emergency Medicine

## 2014-03-06 ENCOUNTER — Emergency Department (HOSPITAL_BASED_OUTPATIENT_CLINIC_OR_DEPARTMENT_OTHER): Payer: Medicaid Other

## 2014-03-06 ENCOUNTER — Encounter (HOSPITAL_BASED_OUTPATIENT_CLINIC_OR_DEPARTMENT_OTHER): Payer: Self-pay | Admitting: *Deleted

## 2014-03-06 DIAGNOSIS — I10 Essential (primary) hypertension: Secondary | ICD-10-CM | POA: Diagnosis not present

## 2014-03-06 DIAGNOSIS — G8929 Other chronic pain: Secondary | ICD-10-CM | POA: Diagnosis not present

## 2014-03-06 DIAGNOSIS — Z791 Long term (current) use of non-steroidal anti-inflammatories (NSAID): Secondary | ICD-10-CM | POA: Insufficient documentation

## 2014-03-06 DIAGNOSIS — Z8739 Personal history of other diseases of the musculoskeletal system and connective tissue: Secondary | ICD-10-CM | POA: Diagnosis not present

## 2014-03-06 DIAGNOSIS — R05 Cough: Secondary | ICD-10-CM | POA: Diagnosis present

## 2014-03-06 DIAGNOSIS — R059 Cough, unspecified: Secondary | ICD-10-CM

## 2014-03-06 DIAGNOSIS — J069 Acute upper respiratory infection, unspecified: Secondary | ICD-10-CM

## 2014-03-06 NOTE — Discharge Instructions (Signed)
Return to the emergency room with worsening of symptoms, new symptoms or with symptoms that are concerning , especially fevers, stiff neck, worsening headache, nausea/vomiting, visual changes or slurred speech, chest pain, shortness of breath, cough with thick colored mucous or blood Drink plenty of fluids with electrolytes especially Gatorade. OTC cold medications such as mucinex, nyquil, dayquil are recommended. Chloraseptic for sore throat. Follow up with primary doctor for persistent symptoms. Read below information and follow all recommendations.   Cough, Adult  A cough is a reflex that helps clear your throat and airways. It can help heal the body or may be a reaction to an irritated airway. A cough may only last 2 or 3 weeks (acute) or may last more than 8 weeks (chronic).  CAUSES Acute cough:  Viral or bacterial infections. Chronic cough:  Infections.  Allergies.  Asthma.  Post-nasal drip.  Smoking.  Heartburn or acid reflux.  Some medicines.  Chronic lung problems (COPD).  Cancer. SYMPTOMS   Cough.  Fever.  Chest pain.  Increased breathing rate.  High-pitched whistling sound when breathing (wheezing).  Colored mucus that you cough up (sputum). TREATMENT   A bacterial cough may be treated with antibiotic medicine.  A viral cough must run its course and will not respond to antibiotics.  Your caregiver may recommend other treatments if you have a chronic cough. HOME CARE INSTRUCTIONS   Only take over-the-counter or prescription medicines for pain, discomfort, or fever as directed by your caregiver. Use cough suppressants only as directed by your caregiver.  Use a cold steam vaporizer or humidifier in your bedroom or home to help loosen secretions.  Sleep in a semi-upright position if your cough is worse at night.  Rest as needed.  Stop smoking if you smoke. SEEK IMMEDIATE MEDICAL CARE IF:   You have pus in your sputum.  Your cough starts to  worsen.  You cannot control your cough with suppressants and are losing sleep.  You begin coughing up blood.  You have difficulty breathing.  You develop pain which is getting worse or is uncontrolled with medicine.  You have a fever. MAKE SURE YOU:   Understand these instructions.  Will watch your condition.  Will get help right away if you are not doing well or get worse. Document Released: 07/30/2010 Document Revised: 04/25/2011 Document Reviewed: 07/30/2010 [facility] Patient Information 2015 [place], [place]. This information is not intended to replace advice given to you by your health care provider. Make sure you discuss any questions you have with your health care provider.  Upper Respiratory Infection, Adult An upper respiratory infection (URI) is also sometimes known as the common cold. The upper respiratory tract includes the nose, sinuses, throat, trachea, and bronchi. Bronchi are the airways leading to the lungs. Most people improve within 1 week, but symptoms can last up to 2 weeks. A residual cough may last even longer.  CAUSES Many different viruses can infect the tissues lining the upper respiratory tract. The tissues become irritated and inflamed and often become very moist. Mucus production is also common. A cold is contagious. You can easily spread the virus to others by oral contact. This includes kissing, sharing a glass, coughing, or sneezing. Touching your mouth or nose and then touching a surface, which is then touched by another person, can also spread the virus. SYMPTOMS  Symptoms typically develop 1 to 3 days after you come in contact with a cold virus. Symptoms vary from person to person. They may include:  Runny  nose.  Sneezing.  Nasal congestion.  Sinus irritation.  Sore throat.  Loss of voice (laryngitis).  Cough.  Fatigue.  Muscle aches.  Loss of appetite.  Headache.  Low-grade fever. DIAGNOSIS  You might diagnose your own cold based  on familiar symptoms, since most people get a cold 2 to 3 times a year. Your caregiver can confirm this based on your exam. Most importantly, your caregiver can check that your symptoms are not due to another disease such as strep throat, sinusitis, pneumonia, asthma, or epiglottitis. Blood tests, throat tests, and X-rays are not necessary to diagnose a common cold, but they may sometimes be helpful in excluding other more serious diseases. Your caregiver will decide if any further tests are required. RISKS AND COMPLICATIONS  You may be at risk for a more severe case of the common cold if you smoke cigarettes, have chronic heart disease (such as heart failure) or lung disease (such as asthma), or if you have a weakened immune system. The very young and very old are also at risk for more serious infections. Bacterial sinusitis, middle ear infections, and bacterial pneumonia can complicate the common cold. The common cold can worsen asthma and chronic obstructive pulmonary disease (COPD). Sometimes, these complications can require emergency medical care and may be life-threatening. PREVENTION  The best way to protect against getting a cold is to practice good hygiene. Avoid oral or hand contact with people with cold symptoms. Wash your hands often if contact occurs. There is no clear evidence that vitamin C, vitamin E, echinacea, or exercise reduces the chance of developing a cold. However, it is always recommended to get plenty of rest and practice good nutrition. TREATMENT  Treatment is directed at relieving symptoms. There is no cure. Antibiotics are not effective, because the infection is caused by a virus, not by bacteria. Treatment may include:  Increased fluid intake. Sports drinks offer valuable electrolytes, sugars, and fluids.  Breathing heated mist or steam (vaporizer or shower).  Eating chicken soup or other clear broths, and maintaining good nutrition.  Getting plenty of rest.  Using  gargles or lozenges for comfort.  Controlling fevers with ibuprofen or acetaminophen as directed by your caregiver.  Increasing usage of your inhaler if you have asthma. Zinc gel and zinc lozenges, taken in the first 24 hours of the common cold, can shorten the duration and lessen the severity of symptoms. Pain medicines may help with fever, muscle aches, and throat pain. A variety of non-prescription medicines are available to treat congestion and runny nose. Your caregiver can make recommendations and may suggest nasal or lung inhalers for other symptoms.  HOME CARE INSTRUCTIONS   Only take over-the-counter or prescription medicines for pain, discomfort, or fever as directed by your caregiver.  Use a warm mist humidifier or inhale steam from a shower to increase air moisture. This may keep secretions moist and make it easier to breathe.  Drink enough water and fluids to keep your urine clear or pale yellow.  Rest as needed.  Return to work when your temperature has returned to normal or as your caregiver advises. You may need to stay home longer to avoid infecting others. You can also use a face mask and careful hand washing to prevent spread of the virus. SEEK MEDICAL CARE IF:   After the first few days, you feel you are getting worse rather than better.  You need your caregiver's advice about medicines to control symptoms.  You develop chills, worsening shortness of  breath, or brown or red sputum. These may be signs of pneumonia.  You develop yellow or brown nasal discharge or pain in the face, especially when you bend forward. These may be signs of sinusitis.  You develop a fever, swollen neck glands, pain with swallowing, or white areas in the back of your throat. These may be signs of strep throat. SEEK IMMEDIATE MEDICAL CARE IF:   You have a fever.  You develop severe or persistent headache, ear pain, sinus pain, or chest pain.  You develop wheezing, a prolonged cough, cough  up blood, or have a change in your usual mucus (if you have chronic lung disease).  You develop sore muscles or a stiff neck. Document Released: 07/27/2000 Document Revised: 04/25/2011 Document Reviewed: 05/08/2013 [facility] Patient Information 2015 [place], [place]. This information is not intended to replace advice given to you by your health care provider. Make sure you discuss any questions you have with your health care provider.

## 2014-03-06 NOTE — ED Notes (Signed)
Pt c/o URI symptoms x 1 week 

## 2014-03-06 NOTE — ED Provider Notes (Signed)
CSN: [id number]     Arrival date & time 03/06/14  1527 History   First MD Initiated Contact with Patient 03/06/14 1531     Chief Complaint  Patient presents with  . URI     (Consider location/radiation/quality/duration/timing/severity/associated sxs/prior Treatment) HPI  Craig [name] is a 34 y.o. male with PMH of hypertension, chronic back pain, gout presenting with URI symptoms for 1 week which includes cough that is productive of a thick green mucus. Patient also endorses subjective fevers but no chest pain or shortness of breath. No history of asthma or smoking. Patient also with sore throat runny nose and congestion. He's been taking Robitussin with mild improvement of his symptoms. Patient also with right-sided ear pain that is described as throbbing started in the last couple days. He denies any discharge.    Past Medical History  Diagnosis Date  . Hypertension   . Chronic back pain   . Gout    Past Surgical History  Procedure Laterality Date  . Tonsillectomy     History reviewed. No pertinent family history. History  Substance Use Topics  . Smoking status: Never Smoker   . Smokeless tobacco: Never Used  . Alcohol Use: No    Review of Systems  Constitutional: Negative for fever and chills.  HENT: Positive for congestion, ear pain and sinus pressure. Negative for ear discharge.   Respiratory: Positive for cough. Negative for chest tightness and shortness of breath.   Cardiovascular: Negative for chest pain and palpitations.  Gastrointestinal: Negative for nausea, vomiting and abdominal pain.      Allergies  Review of patient's allergies indicates no known allergies.  Home Medications   Prior to Admission medications   Medication Sig Start Date End Date Taking? Authorizing Provider  methocarbamol (ROBAXIN-750) 750 MG tablet Take 1 tablet (750 mg total) by mouth 4 (four) times daily. 03/18/13   Toy Baker, MD  naproxen (NAPROSYN) 500 MG tablet Take 1 tablet  (500 mg total) by mouth 2 (two) times daily. 01/02/14   Shon Baton, MD  oxyCODONE-acetaminophen (PERCOCET/ROXICET) 5-325 MG per tablet Take 1 tablet by mouth every 8 (eight) hours as needed for severe pain.    Historical Provider, MD   BP 129/69 mmHg  Pulse 87  Temp(Src) 98.4 F (36.9 C) (Oral)  Resp 20  Ht  (1.753 m)  Wt 390 lb (176.903 kg)  BMI 57.57 kg/m2  SpO2 98% Physical Exam  Constitutional: He appears well-developed and well-nourished. No distress.  HENT:  Head: Normocephalic and atraumatic.  Right Ear: Tympanic membrane and external ear normal.  Left Ear: Tympanic membrane and external ear normal.  Nose: Right sinus exhibits no maxillary sinus tenderness and no frontal sinus tenderness. Left sinus exhibits no maxillary sinus tenderness and no frontal sinus tenderness.  Mouth/Throat: Mucous membranes are normal. Posterior oropharyngeal edema and posterior oropharyngeal erythema present. No oropharyngeal exudate.  Eyes: Conjunctivae and EOM are normal. Right eye exhibits no discharge. Left eye exhibits no discharge.  Neck: Normal range of motion. Neck supple.  Cardiovascular: Normal rate, regular rhythm and normal heart sounds.   Pulmonary/Chest: Effort normal and breath sounds normal. No respiratory distress. He has no wheezes. He has no rales.  Abdominal: Soft. Bowel sounds are normal. He exhibits no distension. There is no tenderness.  Lymphadenopathy:    He has cervical adenopathy.  Neurological: He is alert.  Skin: Skin is warm and dry. He is not diaphoretic.  Nursing note and vitals reviewed.   ED  Course  Procedures (including critical care time) Labs Review Labs Reviewed - No data to display  Imaging Review Dg Chest 2 View  03/06/2014   CLINICAL DATA:  Cough and upper respiratory tract symptoms for 2 weeks  EXAM: CHEST  2 VIEW  COMPARISON:  05/13/2011  FINDINGS: Cardiac shadow is within normal limits. The lungs are well-aerated without focal infiltrate  or sizable effusion. Very mild peribronchial changes are noted which may be related to bronchitis. No bony abnormality is seen.  IMPRESSION: Mild peribronchial cuffing.   Electronically Signed   By: Alcide CleverMark  Lukens M.D.   On: 03/06/2014 16:32     EKG Interpretation None      MDM   Final diagnoses:  Cough  URI (upper respiratory infection)   Pt CXR negative for acute infiltrate. Mild peribronchial changes likely related to bronchitis. Patients symptoms are consistent with URI, likely viral etiology. Discussed that antibiotics are not indicated for viral infections. Pt will be discharged with symptomatic treatment.  Verbalizes understanding and is agreeable with plan. Pt is hemodynamically stable & in NAD prior to dc. Pt to follow up with PCP for persistent symptoms.  Discussed return precautions with patient. Discussed all results and patient verbalizes understanding and agrees with plan.    Louann SjogrenVictoria L Arley Garant, PA-C 03/06/14 1802  Doug SouSam Jacubowitz, MD 03/07/14 [phone]

## 2014-04-03 ENCOUNTER — Ambulatory Visit (INDEPENDENT_AMBULATORY_CARE_PROVIDER_SITE_OTHER): Payer: Medicaid Other

## 2014-04-03 VITALS — BP 152/100 | HR 86 | Resp 12

## 2014-04-03 DIAGNOSIS — M722 Plantar fascial fibromatosis: Secondary | ICD-10-CM

## 2014-04-03 DIAGNOSIS — M7662 Achilles tendinitis, left leg: Secondary | ICD-10-CM

## 2014-04-03 DIAGNOSIS — R52 Pain, unspecified: Secondary | ICD-10-CM

## 2014-04-03 MED ORDER — MELOXICAM 15 MG PO TABS: 15.0000 mg | ORAL_TABLET | Freq: Every day | ORAL | Status: DC

## 2014-04-03 NOTE — Progress Notes (Signed)
   Subjective:    Patient ID: Craig [name], male    DOB: March 31, 1980, 34 y.o.   MRN: [id number]  HPI  PT STATED LT BACK/COTTOM OF THE HEEL IS HURTING FOR 5 YEARS. THE HEEL IS GETTING WORSE AND GET AGGRAVATED WHEN WALKING/PRESSURE. TRIED OTC INSERTS AND ICE BUT NO HELP.  Review of Systems  Musculoskeletal: Positive for gait problem.  All other systems reviewed and are negative.      Objective:   Physical Exam 34 year old white male well-developed well-nourished oriented 3 considerably overweight presents this time with a long-term history of heel pain has had some back problems and issues taking may medicine for that however rolls issues considerably having pain in the posterior heel Achilles tendon insertion area as well as the mid arch of the midfoot bilateral. Patient tends to roll the shoes and compensate is gait. No open wounds no ulcers no secondary infections patient is tried stretching for the Achilles is tried different medications with little or no success. X-rays reveal no signs of fracture no inferior calcaneal spur minimal retrocalcaneal spur mild fascial thickening neurovascular status otherwise intact pedal pulses are palpable epicritic and proprioceptive sensations intact       Assessment & Plan:  Assessment this time Achilles tendinitis retrocalcaneal bursitis as well as mild 5 plantar fascial symptomology. Biomechanically patient is rolling and not getting adequate support for stability from shoes recommended a stability or motion control shoe such as a Brooks beast shoe in the future. At this time recommendation for OTC orthotic has been trying gel insoles with no success at this time dispensed 1 power step orthotic which fit and contour well to the foot written instructions for orthotic use and break in are given also will try NSAID meloxicam 15 minutes once daily and did recommend warm compress ice pack to the posterior heel Achilles tendon insertion reevaluate within 3-4  weeks for follow-up  Alvan Dameichard Parneet Glantz DPM

## 2014-04-03 NOTE — Patient Instructions (Addendum)
WEARING INSTRUCTIONS FOR ORTHOTICS  Don't expect to be comfortable wearing your orthotic devices for the first time.  Like eyeglasses, you may be aware of them as time passes, they will not be uncomfortable and you will enjoy wearing them.  FOLLOW THESE INSTRUCTIONS EXACTLY!  1. Wear your orthotic devices for:       Not more than 1 hour the first day.       Not more than 2 hours the second day.       Not more than 3 hours the third day and so on.        Or wear them for as long as they feel comfortable.       If you experience discomfort in your feet or legs take them out.  When feet & legs feel       better, put them back in.  You do need to be consistent and wear them a little        everyday. 2.   If at any time the orthotic devices become acutely uncomfortable before the       time for that particular day, STOP WEARING THEM. 3.   On the next day, do not increase the wearing time. 4.   Subsequently, increase the wearing time by 15-30 minutes only if comfortable to do       so. 5.   You will be seen by your doctor about 2-4 weeks after you receive your orthotic       devices, at which time you will probably be wearing your devices comfortably        for about 8 hours or more a day. 6.   Some patients occasionally report mild aches or discomfort in other parts of the of       body such as the knees, hips or back after 3 or 4 consecutive hours of wear.  If this       is the case with you, do not extend your wearing time.  Instead, cut it back an hour or       two.  In all likelihood, these symptoms will disappear in a short period of time as your       body posture realigns itself and functions more efficiently. 7.   It is possible that your orthotic device may require some small changes or adjustment       to improve their function or make them more comfortable.   This is usually not done       before one to three months have elapsed.  These adjustments are made in        accordance  with the changed position your feet are assuming as a result of       improved biomechanical function. 8.   In women's shoes, it's not unusual for your heel to slip out of the shoe, particularly if       they are step-in-shoes.  If this is the case, try other shoes or other styles.  Try to       purchase shoes which have deeper heal seats or higher heel counters. 9.   Squeaking of orthotics devices in the shoes is due to the movement of the devices       when they are functioning normally.  To eliminate squeaking, simply dust some       baby powder into your shoes before inserting the devices.  If this does not work,          apply soap or wax to the edges of the orthotic devices or put a tissue into the shoes. 10. It is important that you follow these directions explicitly.  Failure to do so will simply       prolong the adjustment period or create problems which are easily avoided.  It makes       no difference if you are wearing your orthotic devices for only a few hours after        several months, so long as you are wearing them comfortably for those hours. 11. If you have any questions or complaints, contact our office.  We have no way of       knowing about your problems unless you tell us.  If we do not hear from you, we will       assume that you are proceeding well.    ICE INSTRUCTIONS  Apply ice or cold pack to the affected area at least 3 times a day for 10-15 minutes each time.  You should also use ice after prolonged activity or vigorous exercise.  Do not apply ice longer than 20 minutes at one time.  Always keep a cloth between your skin and the ice pack to prevent burns.  Being consistent and following these instructions will help control your symptoms.  We suggest you purchase a gel ice pack because they are reusable and do bit leak.  Some of them are designed to wrap around the area.  Use the method that works best for you.  Here are some other suggestions for icing.   Use a  frozen bag of peas or corn-inexpensive and molds well to your body, usually stays frozen for 10 to 20 minutes.  Wet a towel with cold water and squeeze out the excess until it's damp.  Place in a bag in the freezer for 20 minutes. Then remove and use.  Alternate warm or hot compress for 10 minutes and ice pack for 10 minutes repeat 2 or 3 times every evening to the back of heel

## 2014-05-01 ENCOUNTER — Ambulatory Visit: Payer: Medicaid Other

## 2015-07-30 ENCOUNTER — Encounter (HOSPITAL_COMMUNITY): Payer: Self-pay | Admitting: Neurology

## 2015-07-30 ENCOUNTER — Emergency Department (HOSPITAL_COMMUNITY)
Admission: EM | Admit: 2015-07-30 | Discharge: 2015-07-30 | Disposition: A | Discharge status: Home / Self Care | Payer: Medicaid Other | Attending: Emergency Medicine | Admitting: Emergency Medicine

## 2015-07-30 DIAGNOSIS — J029 Acute pharyngitis, unspecified: Secondary | ICD-10-CM | POA: Insufficient documentation

## 2015-07-30 DIAGNOSIS — I10 Essential (primary) hypertension: Secondary | ICD-10-CM | POA: Insufficient documentation

## 2015-07-30 DIAGNOSIS — M542 Cervicalgia: Secondary | ICD-10-CM | POA: Insufficient documentation

## 2015-07-30 DIAGNOSIS — M25512 Pain in left shoulder: Secondary | ICD-10-CM

## 2015-07-30 MED ORDER — METHOCARBAMOL 500 MG PO TABS: 500.0000 mg | ORAL_TABLET | Freq: Two times a day (BID) | ORAL | Status: DC

## 2015-07-30 MED ORDER — NAPROXEN 500 MG PO TABS: 500.0000 mg | ORAL_TABLET | Freq: Two times a day (BID) | ORAL | Status: DC
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
## 2016-01-02 ENCOUNTER — Emergency Department (HOSPITAL_BASED_OUTPATIENT_CLINIC_OR_DEPARTMENT_OTHER): Payer: Medicaid Other

## 2016-01-02 ENCOUNTER — Emergency Department (HOSPITAL_BASED_OUTPATIENT_CLINIC_OR_DEPARTMENT_OTHER)
Admission: EM | Admit: 2016-01-02 | Discharge: 2016-01-02 | Disposition: A | Discharge status: Home / Self Care | Payer: Medicaid Other | Attending: Emergency Medicine | Admitting: Emergency Medicine

## 2016-01-02 ENCOUNTER — Encounter (HOSPITAL_BASED_OUTPATIENT_CLINIC_OR_DEPARTMENT_OTHER): Payer: Self-pay | Admitting: Emergency Medicine

## 2016-01-02 DIAGNOSIS — Z79899 Other long term (current) drug therapy: Secondary | ICD-10-CM | POA: Diagnosis not present

## 2016-01-02 DIAGNOSIS — J069 Acute upper respiratory infection, unspecified: Secondary | ICD-10-CM

## 2016-01-02 DIAGNOSIS — I10 Essential (primary) hypertension: Secondary | ICD-10-CM | POA: Insufficient documentation

## 2016-01-02 DIAGNOSIS — L259 Unspecified contact dermatitis, unspecified cause: Secondary | ICD-10-CM | POA: Diagnosis not present

## 2016-01-02 DIAGNOSIS — R51 Headache: Secondary | ICD-10-CM | POA: Diagnosis present

## 2016-01-02 MED ORDER — PREDNISONE 20 MG PO TABS: ORAL_TABLET | ORAL | 0 refills | Status: DC

## 2016-01-02 NOTE — ED Triage Notes (Signed)
Pt c/o sinus congestion and pressure along with headache and a "few spots" of rash (redness) on his right eye and right arm.

## 2016-01-02 NOTE — ED Provider Notes (Signed)
[facility]   CSN: [id number] Arrival date & time: 01/02/16  1528  By signing my name below, I, Craig [name], attest that this documentation has been prepared under the direction and in the presence of Rolan Bucco, MD . Electronically Signed: Modena [name], Scribe. 01/02/2016. 4:55 PM.  History   Chief Complaint Chief Complaint  Patient presents with  . Nasal Congestion  . Rash  . Headache   The history is provided by the patient. No language interpreter was used.   HPI Comments: Craig [name] is a 35 y.o. male who presents to the Emergency Department complaining of a constant rash that started 5 days ago. He states he has been having gradually worsening URI-like symptoms for the past 2 weeks and recently noticed a rash. He reports the itchy rash is around his right eye and on his RUE. He reports associated symptoms of nasal and chest congestion and headache. He states he has been taking sudafed for his URI-like symptoms with minimal relief. He denies any fever, nausea, vomiting, or leg swelling.     PCP: Aida Puffer, MD  Past Medical History:  Diagnosis Date  . Anxiety   . Chronic back pain   . Gout   . Hypertension     There are no active problems to display for this patient.   Past Surgical History:  Procedure Laterality Date  . TONSILLECTOMY         Home Medications    Prior to Admission medications   Medication Sig Start Date End Date Taking? Authorizing Provider  FLUoxetine (PROZAC) 20 MG tablet Take 20 mg by mouth daily.   Yes Historical Provider, MD  meloxicam (MOBIC) 15 MG tablet Take 1 tablet (15 mg total) by mouth daily. Patient not taking: Reported on 07/30/2015 04/03/14   Alvan Dame, DPM  methocarbamol (ROBAXIN) 500 MG tablet Take 1 tablet (500 mg total) by mouth 2 (two) times daily. 07/30/15   Heather Laisure, PA-C  naproxen (NAPROSYN) 500 MG tablet Take 1 tablet (500 mg total) by mouth 2 (two) times daily. 07/30/15    Santiago Glad, PA-C  oxyCODONE-acetaminophen (PERCOCET/ROXICET) 5-325 MG per tablet Take 1 tablet by mouth every 8 (eight) hours as needed for severe pain.    Historical Provider, MD  predniSONE (DELTASONE) 20 MG tablet 3 tabs po day one, then 2 po daily x 4 days 01/02/16   Rolan Bucco, MD    Family History No family history on file.  Social History Social History  Substance Use Topics  . Smoking status: Never Smoker  . Smokeless tobacco: Never Used  . Alcohol use No     Allergies   Patient has no known allergies.   Review of Systems Review of Systems  Constitutional: Negative for chills, diaphoresis, fatigue and fever.  HENT: Positive for congestion (Nasal, chest), postnasal drip and rhinorrhea. Negative for sneezing.   Eyes: Negative.   Respiratory: Positive for cough. Negative for chest tightness and shortness of breath.   Cardiovascular: Negative for chest pain and leg swelling.  Gastrointestinal: Negative for abdominal pain, blood in stool, diarrhea, nausea and vomiting.  Genitourinary: Negative for difficulty urinating, flank pain, frequency and hematuria.  Musculoskeletal: Negative for arthralgias and back pain.  Skin: Positive for rash.  Neurological: Positive for headaches. Negative for dizziness, speech difficulty, weakness and numbness.     Physical Exam Updated Vital Signs BP 143/93 (BP Location: Left Arm)   Pulse 110   Temp 98.3 F (36.8 C) (Oral)  Resp 20   Ht 5\' 9"  (1.753 m)   Wt (!) 375 lb (170.1 kg)   SpO2 97%   BMI 55.38 kg/m   Physical Exam  Constitutional: He is oriented to person, place, and time. He appears well-developed and well-nourished.  HENT:  Head: Normocephalic and atraumatic.  Right Ear: External ear normal.  Left Ear: External ear normal.  Eyes: Pupils are equal, round, and reactive to light.  Neck: Normal range of motion. Neck supple.  Cardiovascular: Normal rate, regular rhythm and normal heart sounds.     Pulmonary/Chest: Effort normal and breath sounds normal. No respiratory distress. He has no wheezes. He has no rales. He exhibits no tenderness.  Abdominal: Soft. Bowel sounds are normal. There is no tenderness. There is no rebound and no guarding.  Musculoskeletal: Normal range of motion. He exhibits no edema.  Lymphadenopathy:    He has no cervical adenopathy.  Neurological: He is alert and oriented to person, place, and time.  Skin: Skin is warm and dry. No rash noted.  Patient has a small area of an erythematous scaly rash under his right eye. There is no vesicles. No eye involvement. He has a small similar lesion to his right forearm.  Psychiatric: He has a normal mood and affect.  Nursing note and vitals reviewed.    ED Treatments / Results  DIAGNOSTIC STUDIES: Oxygen Saturation is 97% on RA, normal by my interpretation.    COORDINATION OF CARE: 4:59 PM- Pt advised of plan for treatment and pt agrees.  Labs (all labs ordered are listed, but only abnormal results are displayed) Labs Reviewed - No data to display  EKG  EKG Interpretation None       Radiology Dg Chest 2 View  Result Date: 01/02/2016 CLINICAL DATA:  Cough, congestion for 2 weeks.  Chest tightness. EXAM: CHEST  2 VIEW COMPARISON:  03/06/2014 FINDINGS: The heart size and mediastinal contours are within normal limits. Both lungs are clear. The visualized skeletal structures are unremarkable. IMPRESSION: No active cardiopulmonary disease. Electronically Signed   By: Elige KoHetal  Patel   On: 01/02/2016 17:27    Procedures Procedures (including critical care time)  Medications Ordered in ED Medications - No data to display   Initial Impression / Assessment and Plan / ED Course  I have reviewed the triage vital signs and the nursing notes.  Pertinent labs & imaging results that were available during my care of the patient were reviewed by me and considered in my medical decision making (see chart for  details).  Clinical Course     Patient presents with URI symptoms. There is no evidence of pneumonia. No other evidence of bacterial infections. He is well appearing. He has a small area of rash just under his right eye and his right forearm. It's pruritic. He thinks that he was exposed to poison ivy but there is no vesicles. It does look like some sort of a contact dermatitis. Given the anatomic days, I did not prescribe topical steroids but I did advise him to use a moisturizing cream such as Aquaphor to the areas. I also prescribed him a five-day course of prednisone which should help with a rash as well as his sinus pressure. Also encouraged him to use Mucinex. He was advised to follow-up with his PCP if his symptoms are not improving or return here as needed for any worsening symptoms.  Final Clinical Impressions(s) / ED Diagnoses   Final diagnoses:  Viral upper respiratory tract infection  Contact  dermatitis, unspecified contact dermatitis type, unspecified trigger    New Prescriptions New Prescriptions   PREDNISONE (DELTASONE) 20 MG TABLET    3 tabs po day one, then 2 po daily x 4 days   I personally performed the services described in this documentation, which was scribed in my presence.  The recorded information has been reviewed and considered.     Rolan BuccoMelanie Overton Boggus, MD 01/02/16 [phone]

## 2016-11-10 ENCOUNTER — Emergency Department (HOSPITAL_COMMUNITY)
Admission: EM | Admit: 2016-11-10 | Discharge: 2016-11-10 | Disposition: A | Discharge status: Home / Self Care | Payer: Medicaid Other | Attending: Emergency Medicine | Admitting: Emergency Medicine

## 2016-11-10 ENCOUNTER — Encounter (HOSPITAL_COMMUNITY): Payer: Self-pay

## 2016-11-10 ENCOUNTER — Emergency Department (HOSPITAL_COMMUNITY): Payer: Medicaid Other

## 2016-11-10 DIAGNOSIS — I10 Essential (primary) hypertension: Secondary | ICD-10-CM | POA: Diagnosis not present

## 2016-11-10 DIAGNOSIS — M79672 Pain in left foot: Secondary | ICD-10-CM | POA: Diagnosis present

## 2016-11-10 DIAGNOSIS — W19XXXA Unspecified fall, initial encounter: Secondary | ICD-10-CM

## 2016-11-10 DIAGNOSIS — Z8739 Personal history of other diseases of the musculoskeletal system and connective tissue: Secondary | ICD-10-CM | POA: Diagnosis not present

## 2016-11-10 NOTE — ED Provider Notes (Signed)
[facility] Provider Note   CSN: [id number] Arrival date & time: 11/10/16  4098     History   Chief Complaint Chief Complaint  Patient presents with  . Fall    HPI Craig [name] is a 36 y.o. male.  The history is provided by the patient and medical records.  Fall     36 year old male with history of anxiety, chronic back pain, gout, hypertension, presenting to the ED with left foot pain.  Patient states he tripped and fell about 6 days ago twisting his foot and bending his toes backwards. States he saw his primary care doctor and was told he had a "sprain". Reports he has been taking pain medication without any significant improvement. States he's had continued pain and swelling of his foot and has started developing some bruising as well. He has not had any numbness or weakness. No prior foot or ankle injuries in the past.  Past Medical History:  Diagnosis Date  . Anxiety   . Chronic back pain   . Gout   . Hypertension     There are no active problems to display for this patient.   Past Surgical History:  Procedure Laterality Date  . TONSILLECTOMY         Home Medications    Prior to Admission medications   Medication Sig Start Date End Date Taking? Authorizing Provider  FLUoxetine (PROZAC) 20 MG tablet Take 20 mg by mouth daily.    [provider]  meloxicam (MOBIC) 15 MG tablet Take 1 tablet (15 mg total) by mouth daily. Patient not taking: Reported on 07/30/2015 04/03/14   Alvan Dame, DPM  methocarbamol (ROBAXIN) 500 MG tablet Take 1 tablet (500 mg total) by mouth 2 (two) times daily. 07/30/15   Santiago Glad, PA-C  naproxen (NAPROSYN) 500 MG tablet Take 1 tablet (500 mg total) by mouth 2 (two) times daily. 07/30/15   Santiago Glad, PA-C  oxyCODONE-acetaminophen (PERCOCET/ROXICET) 5-325 MG per tablet Take 1 tablet by mouth every 8 (eight) hours as needed for severe pain.    [provider]  predniSONE (DELTASONE) 20 MG  tablet 3 tabs po day one, then 2 po daily x 4 days 01/02/16   Rolan Bucco, MD    Family History No family history on file.  Social History Social History  Substance Use Topics  . Smoking status: Never Smoker  . Smokeless tobacco: Never Used  . Alcohol use No     Allergies   Patient has no known allergies.   Review of Systems Review of Systems  Musculoskeletal: Positive for arthralgias.  All other systems reviewed and are negative.    Physical Exam Updated Vital Signs BP (!) 150/88 (BP Location: Right Wrist)   Pulse 92   Temp 98.2 F (36.8 C) (Oral)   Resp 18   Ht  (1.753 m)   Wt (!) 181.4 kg (400 lb)   SpO2 94%   BMI 59.07 kg/m   Physical Exam  Constitutional: He is oriented to person, place, and time. He appears well-developed and well-nourished.  Morbidly obese  HENT:  Head: Normocephalic and atraumatic.  Mouth/Throat: Oropharynx is clear and moist.  Eyes: Pupils are equal, round, and reactive to light. Conjunctivae and EOM are normal.  Neck: Normal range of motion.  Cardiovascular: Normal rate, regular rhythm and normal heart sounds.   Pulmonary/Chest: Effort normal and breath sounds normal.  Abdominal: Soft. Bowel sounds are normal.  Musculoskeletal: Normal range of motion.  Left foot with  swelling and bruising along the distal metatarsals and laterally along the fifth metatarsal; there is no acute deformity noted, moving toes normally, normal sensation and cap refill; ambulatory but with limp  Neurological: He is alert and oriented to person, place, and time.  Skin: Skin is warm and dry.  Psychiatric: He has a normal mood and affect.  Nursing note and vitals reviewed.    ED Treatments / Results  Labs (all labs ordered are listed, but only abnormal results are displayed) Labs Reviewed - No data to display  EKG  EKG Interpretation None       Radiology Dg Foot Complete Left  Result Date: 11/10/2016 CLINICAL DATA:  Tripped 6 days ago,  toes bent backwards, persistent pain and bruising EXAM: LEFT FOOT - COMPLETE 3+ VIEW COMPARISON:  None FINDINGS: Osseous mineralization normal. Joint spaces preserved. Diffuse soft tissue swelling LEFT foot. No acute fracture, dislocation, or bone destruction. Small Achilles insertion calcaneal spur. IMPRESSION: No acute osseous abnormalities. Electronically Signed   By: Ulyses Southward M.D.   On: 11/10/2016 12:14    Procedures Procedures (including critical care time)  Medications Ordered in ED Medications - No data to display   Initial Impression / Assessment and Plan / ED Course  I have reviewed the triage vital signs and the nursing notes.  Pertinent labs & imaging results that were available during my care of the patient were reviewed by me and considered in my medical decision making (see chart for details).  36 y.o. M here with left foot pain after fall 1 week ago.  Does have some swelling and bruising on exam. No gross deformities.  X-ray obtained, no acute findings.  Suspect sprain type injury.  Patient is having difficulty wearing shoes, will place in post-op shoe.  Supportive care at home with NSAIDs, ice and elevation.  Can follow-up with PCP, also given referral to orthopedics if needed.  Discussed plan with patient, he acknowledged understanding and agreed with plan of care.  Return precautions given for new or worsening symptoms.  Final Clinical Impressions(s) / ED Diagnoses   Final diagnoses:  Fall, initial encounter  Left foot pain    New Prescriptions Discharge Medication List as of 11/10/2016  1:02 PM       Garlon Hatchet, PA-C 11/10/16 1333    Gerhard Munch, MD 11/10/16 [phone]

## 2016-11-10 NOTE — ED Triage Notes (Signed)
Pt presents to the ed with ems for complaints of a fall on Saturday, he wen to his doctors office and was diagnosed with a sprained ankle. He is still having pain and swelling so he would like an xray to be done.

## 2016-11-10 NOTE — ED Notes (Signed)
Patient returned from xray.

## 2016-11-10 NOTE — Discharge Instructions (Signed)
As we discussed x-ray was negative.  Recommend anti-inflammatories, ice and elevation at home. Follow-up with your primary care doctor.  Can also see orthopedics if ongoing issues. Return here for new concerns.

## 2017-02-16 ENCOUNTER — Other Ambulatory Visit: Payer: Self-pay

## 2017-02-16 ENCOUNTER — Encounter (HOSPITAL_BASED_OUTPATIENT_CLINIC_OR_DEPARTMENT_OTHER): Payer: Self-pay

## 2017-02-16 ENCOUNTER — Emergency Department (HOSPITAL_BASED_OUTPATIENT_CLINIC_OR_DEPARTMENT_OTHER)
Admission: EM | Admit: 2017-02-16 | Discharge: 2017-02-17 | Disposition: A | Discharge status: Home / Self Care | Payer: Medicaid Other | Attending: Emergency Medicine | Admitting: Emergency Medicine

## 2017-02-16 ENCOUNTER — Emergency Department (HOSPITAL_BASED_OUTPATIENT_CLINIC_OR_DEPARTMENT_OTHER): Payer: Medicaid Other

## 2017-02-16 DIAGNOSIS — B9789 Other viral agents as the cause of diseases classified elsewhere: Secondary | ICD-10-CM | POA: Insufficient documentation

## 2017-02-16 DIAGNOSIS — J069 Acute upper respiratory infection, unspecified: Secondary | ICD-10-CM

## 2017-02-16 NOTE — ED Triage Notes (Signed)
Pt c/o back pain x 2 days ago. Pt also c/o cough and ShOB that started yesterday. Pt endorses being in contact with a family member that had the flu.

## 2017-02-17 NOTE — ED Provider Notes (Signed)
[facility] Provider Note: Craig [name] [name], MD, FACEP  CSN: [id number] MRN: [id number] ARRIVAL: 02/16/17 at 2309 ROOM: [facility]   CHIEF COMPLAINT  URI   HISTORY OF PRESENT ILLNESS  02/17/17 2:07 AM [name] is a 37 y.o. male with a 3-day history of respiratory symptoms.  Specifically he has had body aches, nasal congestion, scratchy throat and cough.  He denies shortness of breath to me although it was noted in the nursing notes.  He denies vomiting or diarrhea.  He has not been taking anything for his symptoms.  He has had sick contacts recently.  He is currently having a gout flare in his heels and also an exacerbation of his chronic back pain.  He has colchicine for gout.   Past Medical History:  Diagnosis Date  . Anxiety   . Chronic back pain   . Gout   . Hypertension     Past Surgical History:  Procedure Laterality Date  . TONSILLECTOMY      No family history on file.  Social History   Tobacco Use  . Smoking status: Never Smoker  . Smokeless tobacco: Never Used  Substance Use Topics  . Alcohol use: No  . Drug use: No    Prior to Admission medications   Medication Sig Start Date End Date Taking? Authorizing Provider  FLUoxetine (PROZAC) 20 MG tablet Take 20 mg by mouth daily.    [provider]  oxyCODONE-acetaminophen (PERCOCET/ROXICET) 5-325 MG per tablet Take 1 tablet by mouth every 8 (eight) hours as needed for severe pain.    [provider]    Allergies Patient has no known allergies.   REVIEW OF SYSTEMS  Negative except as noted here or in the History of Present Illness.   PHYSICAL EXAMINATION  Initial Vital Signs Blood pressure (!) 149/99, pulse 90, temperature 98.9 F (37.2 C), temperature source Oral, resp. rate 20, height 5\' 9"  (1.753 m), weight (!) 176.9 kg (390 lb), SpO2 96 %.  Examination General: Well-developed, obese male in no acute distress; appearance consistent with age of record HENT:  normocephalic; atraumatic; pharynx normal Eyes: pupils equal, round and reactive to light; extraocular muscles intact Neck: supple Heart: regular rate and rhythm Lungs: clear to auscultation bilaterally Abdomen: soft; nondistended; nontender; bowel sounds present Extremities: No deformity; full range of motion Neurologic: Awake, alert and oriented; motor function intact in all extremities and symmetric; no facial droop Skin: Warm and dry Psychiatric: Normal mood and affect   RESULTS  Summary of this visit's results, reviewed by myself:   EKG Interpretation  Date/Time:    Ventricular Rate:    PR Interval:    QRS Duration:   QT Interval:    QTC Calculation:   R Axis:     Text Interpretation:        Laboratory Studies: No results found for this or any previous visit (from the past 24 hour(s)). Imaging Studies: Dg Chest 2 View  Result Date: 02/16/2017 CLINICAL DATA:  Cough and shortness of breath for 1 day. Back pain for 2 days. EXAM: CHEST  2 VIEW COMPARISON:  01/02/2016 FINDINGS: The heart size and mediastinal contours are within normal limits. Both lungs are clear. The visualized skeletal structures are unremarkable. IMPRESSION: No active cardiopulmonary disease. Electronically Signed   By: Burman NievesWilliam  Stevens M.D.   On: 02/16/2017 23:51    ED COURSE  Nursing notes and initial vitals signs, including pulse oximetry, reviewed.  Vitals:   02/16/17 2315 02/16/17 2318  BP:  (!) 149/99  Pulse:  90  Resp:  20  Temp:  98.9 F (37.2 C)  TempSrc:  Oral  SpO2:  96%  Weight: (!) 176.9 kg (390 lb)   Height: 5\' 9"  (1.753 m)     PROCEDURES    ED DIAGNOSES     ICD-10-CM   1. Viral URI with cough J06.9    B97.89        [name], MD 02/17/17 1610

## 2019-08-06 ENCOUNTER — Other Ambulatory Visit: Payer: Self-pay | Admitting: Podiatry

## 2019-08-06 ENCOUNTER — Ambulatory Visit: Payer: Medicaid Other | Admitting: Podiatry

## 2019-08-06 DIAGNOSIS — M79671 Pain in right foot: Secondary | ICD-10-CM

## 2019-08-12 ENCOUNTER — Ambulatory Visit: Payer: Medicaid Other | Admitting: Podiatry

## 2019-08-20 ENCOUNTER — Ambulatory Visit
Admission: RE | Admit: 2019-08-20 | Discharge: 2019-08-20 | Disposition: A | Discharge status: Home / Self Care | Payer: Medicaid Other | Source: Ambulatory Visit | Attending: Family Medicine | Admitting: Family Medicine

## 2019-08-20 ENCOUNTER — Other Ambulatory Visit: Payer: Self-pay | Admitting: Family Medicine

## 2019-08-20 DIAGNOSIS — M25562 Pain in left knee: Secondary | ICD-10-CM

## 2019-08-20 DIAGNOSIS — M79671 Pain in right foot: Secondary | ICD-10-CM

## 2019-08-20 DIAGNOSIS — M545 Low back pain, unspecified: Secondary | ICD-10-CM

## 2019-08-26 ENCOUNTER — Ambulatory Visit (INDEPENDENT_AMBULATORY_CARE_PROVIDER_SITE_OTHER): Payer: Medicaid Other | Admitting: Podiatry

## 2019-08-26 ENCOUNTER — Other Ambulatory Visit: Payer: Self-pay

## 2019-08-26 DIAGNOSIS — M7731 Calcaneal spur, right foot: Secondary | ICD-10-CM | POA: Diagnosis not present

## 2019-08-26 DIAGNOSIS — M7661 Achilles tendinitis, right leg: Secondary | ICD-10-CM | POA: Diagnosis not present

## 2019-08-26 DIAGNOSIS — M216X9 Other acquired deformities of unspecified foot: Secondary | ICD-10-CM

## 2019-08-26 MED ORDER — MELOXICAM 15 MG PO TABS: 15.0000 mg | ORAL_TABLET | Freq: Every day | ORAL | 0 refills | Status: DC

## 2019-08-26 NOTE — Patient Instructions (Signed)

## 2019-08-26 NOTE — Progress Notes (Signed)
  Subjective:  Patient ID: Craig [name], male    DOB: 08/13/1980,  MRN: [id number]  Chief Complaint  Patient presents with  . Foot Pain    Rt back heel pian, all toes (1-5th) and Rt 1st met shaft pain x several years, gettting worse; no injury -wrose with activities -w/ swelling tx: none -pt statest he saw PCP was referred to GI for x-rays and PCP dz with heel spur pressing on achilles    39 y.o. male presents with the above complaint. History confirmed with patient.  Objective:  Physical Exam: warm, good capillary refill, no trophic changes or ulcerative lesions, normal DP and PT pulses, and normal sensory exam. Right Foot: tenderness to palpation posterior calcaneus, no pain with calcaneal squeeze, decreased ankle joint ROM, and +Silverskiold test normal exam, no swelling, tenderness, instability; ligaments intact, full range of motion of all ankle/foot joints other than findings noted above.  Radiographs: X-ray of the right foot: prior XR 08/20/19 reviewed posterior heel spur noted  Assessment:  1. Achilles tendinitis of right lower extremity   2. Equinus deformity of foot   3. Calcaneal spur of right foot    Plan:  Patient was evaluated and treated and all questions answered.  Achilles Tendonitis -XR reviewed with patient -Educated on stretching and icing of the affected limb. -Referral placed to physical therapy. -Rx for Meloxicam. Advised on risks, benefits, and alternatives of the medication  Return in about 6 weeks (around 10/07/2019) for Tendonitis.

## 2019-10-07 ENCOUNTER — Ambulatory Visit (INDEPENDENT_AMBULATORY_CARE_PROVIDER_SITE_OTHER): Payer: Medicaid Other | Admitting: Podiatry

## 2019-10-07 DIAGNOSIS — Z5329 Procedure and treatment not carried out because of patient's decision for other reasons: Secondary | ICD-10-CM

## 2019-10-07 NOTE — Progress Notes (Signed)
No show for appt. 

## 2020-01-04 ENCOUNTER — Encounter (HOSPITAL_COMMUNITY): Payer: Self-pay | Admitting: *Deleted

## 2020-01-04 ENCOUNTER — Other Ambulatory Visit: Payer: Self-pay

## 2020-01-04 ENCOUNTER — Emergency Department (HOSPITAL_COMMUNITY)
Admission: EM | Admit: 2020-01-04 | Discharge: 2020-01-04 | Disposition: A | Discharge status: Home / Self Care | Payer: Medicaid Other | Attending: Emergency Medicine | Admitting: Emergency Medicine

## 2020-01-04 ENCOUNTER — Emergency Department (HOSPITAL_COMMUNITY): Payer: Medicaid Other

## 2020-01-04 DIAGNOSIS — Z79899 Other long term (current) drug therapy: Secondary | ICD-10-CM | POA: Diagnosis not present

## 2020-01-04 DIAGNOSIS — I10 Essential (primary) hypertension: Secondary | ICD-10-CM | POA: Insufficient documentation

## 2020-01-04 DIAGNOSIS — R509 Fever, unspecified: Secondary | ICD-10-CM

## 2020-01-04 DIAGNOSIS — K529 Noninfective gastroenteritis and colitis, unspecified: Secondary | ICD-10-CM

## 2020-01-04 DIAGNOSIS — R1084 Generalized abdominal pain: Secondary | ICD-10-CM

## 2020-01-04 DIAGNOSIS — R112 Nausea with vomiting, unspecified: Secondary | ICD-10-CM | POA: Diagnosis not present

## 2020-01-04 DIAGNOSIS — R630 Anorexia: Secondary | ICD-10-CM | POA: Diagnosis not present

## 2020-01-04 DIAGNOSIS — R1033 Periumbilical pain: Secondary | ICD-10-CM | POA: Diagnosis present

## 2020-01-04 LAB — CBC
HCT: 48.9 % (ref 39.0–52.0)
Hemoglobin: 15.9 g/dL (ref 13.0–17.0)
MCH: 29.6 pg (ref 26.0–34.0)
MCHC: 32.5 g/dL (ref 30.0–36.0)
MCV: 90.9 fL (ref 80.0–100.0)
Platelets: 336 10*3/uL (ref 150–400)
RBC: 5.38 MIL/uL (ref 4.22–5.81)
RDW: 13.2 % (ref 11.5–15.5)
WBC: 17 10*3/uL — ABNORMAL HIGH (ref 4.0–10.5)
nRBC: 0 % (ref 0.0–0.2)

## 2020-01-04 LAB — URINALYSIS, ROUTINE W REFLEX MICROSCOPIC
Bilirubin Urine: NEGATIVE
Glucose, UA: NEGATIVE mg/dL
Hgb urine dipstick: NEGATIVE
Ketones, ur: NEGATIVE mg/dL
Leukocytes,Ua: NEGATIVE
Nitrite: NEGATIVE
Protein, ur: 30 mg/dL — AB
Specific Gravity, Urine: 1.024 (ref 1.005–1.030)
pH: 5 (ref 5.0–8.0)

## 2020-01-04 LAB — PROTIME-INR
INR: 1 (ref 0.8–1.2)
Prothrombin Time: 12.7 seconds (ref 11.4–15.2)

## 2020-01-04 LAB — COMPREHENSIVE METABOLIC PANEL
ALT: 20 U/L (ref 0–44)
AST: 17 U/L (ref 15–41)
Albumin: 3.7 g/dL (ref 3.5–5.0)
Alkaline Phosphatase: 70 U/L (ref 38–126)
Anion gap: 11 (ref 5–15)
BUN: 11 mg/dL (ref 6–20)
CO2: 25 mmol/L (ref 22–32)
Calcium: 9.4 mg/dL (ref 8.9–10.3)
Chloride: 100 mmol/L (ref 98–111)
Creatinine, Ser: 1.13 mg/dL (ref 0.61–1.24)
GFR, Estimated: >60 mL/min (ref >60)
Glucose, Bld: 118 mg/dL — ABNORMAL HIGH (ref 70–99)
Potassium: 4.3 mmol/L (ref 3.5–5.1)
Sodium: 136 mmol/L (ref 135–145)
Total Bilirubin: 1.5 mg/dL — ABNORMAL HIGH (ref 0.3–1.2)
Total Protein: 7.5 g/dL (ref 6.5–8.1)

## 2020-01-04 LAB — LIPASE, BLOOD: Lipase: 26 U/L (ref 11–51)

## 2020-01-04 LAB — LACTIC ACID, PLASMA: Lactic Acid, Venous: 1.8 mmol/L (ref 0.5–1.9)

## 2020-01-04 LAB — APTT: aPTT: 28 seconds (ref 24–36)

## 2020-01-04 MED ORDER — ONDANSETRON HCL 4 MG/2ML IJ SOLN
4.0000 mg | Freq: Once | INTRAMUSCULAR | Status: AC
  Administered 2020-01-04: 4 mg via INTRAVENOUS
  Filled 2020-01-04: qty 2

## 2020-01-04 MED ORDER — ONDANSETRON 4 MG PO TBDP
4.0000 mg | ORAL_TABLET | Freq: Once | ORAL | Status: DC | PRN
  Filled 2020-01-04: qty 1

## 2020-01-04 MED ORDER — ACETAMINOPHEN 325 MG PO TABS
325.0000 mg | ORAL_TABLET | Freq: Once | ORAL | Status: DC
  Filled 2020-01-04: qty 1

## 2020-01-04 MED ORDER — LACTATED RINGERS IV BOLUS (SEPSIS)
1000.0000 mL | Freq: Once | INTRAVENOUS | Status: AC
  Administered 2020-01-04: 1000 mL via INTRAVENOUS

## 2020-01-04 MED ORDER — ACETAMINOPHEN 325 MG PO TABS
650.0000 mg | ORAL_TABLET | Freq: Once | ORAL | Status: AC
  Administered 2020-01-04: 650 mg via ORAL

## 2020-01-04 MED ORDER — ACETAMINOPHEN-CODEINE #3 300-30 MG PO TABS: 2.0000 | ORAL_TABLET | Freq: Once | ORAL | Status: DC

## 2020-01-04 MED ORDER — ONDANSETRON HCL 4 MG/2ML IJ SOLN: 4.0000 mg | Freq: Once | INTRAMUSCULAR | Status: DC

## 2020-01-04 MED ORDER — IOHEXOL 300 MG/ML  SOLN
100.0000 mL | Freq: Once | INTRAMUSCULAR | Status: AC | PRN
  Administered 2020-01-04: 100 mL via INTRAVENOUS

## 2020-01-04 MED ORDER — MORPHINE SULFATE (PF) 4 MG/ML IV SOLN
4.0000 mg | Freq: Once | INTRAVENOUS | Status: AC
  Administered 2020-01-04: 4 mg via INTRAVENOUS
  Filled 2020-01-04: qty 1

## 2020-01-04 MED ORDER — ONDANSETRON 4 MG PO TBDP: 4.0000 mg | ORAL_TABLET | Freq: Three times a day (TID) | ORAL | 0 refills | Status: DC | PRN

## 2020-01-04 MED ORDER — ONDANSETRON 4 MG PO TBDP
4.0000 mg | ORAL_TABLET | Freq: Once | ORAL | Status: AC | PRN
  Administered 2020-01-04: 4 mg via ORAL
  Filled 2020-01-04: qty 1

## 2020-01-04 MED ORDER — LACTATED RINGERS IV SOLN: INTRAVENOUS | Status: DC

## 2020-01-04 MED ORDER — OXYCODONE-ACETAMINOPHEN 5-325 MG PO TABS
2.0000 | ORAL_TABLET | Freq: Once | ORAL | Status: AC
  Administered 2020-01-04: 2 via ORAL
  Filled 2020-01-04: qty 2

## 2020-01-04 MED ORDER — OXYCODONE-ACETAMINOPHEN 5-325 MG PO TABS
1.0000 | ORAL_TABLET | Freq: Four times a day (QID) | ORAL | 0 refills | Status: AC | PRN
Start: 2020-01-04 — End: 2020-01-16

## 2020-01-04 NOTE — ED Notes (Signed)
Pt discharged ambulatory. All questions and concerns addressed. No complaints at this time.   

## 2020-01-04 NOTE — ED Provider Notes (Signed)
[facility] EMERGENCY DEPARTMENT Provider Note   CSN: [id number] Arrival date & time: 01/04/20  1937     History Chief Complaint  Patient presents with  . Abdominal Pain    Craig [name] is a 39 y.o. male.   Abdominal Pain Pain location:  Periumbilical and suprapubic Pain quality: aching   Pain radiates to:  Does not radiate Pain severity:  Severe Onset quality:  Gradual Duration:  2 days Timing:  Constant Progression:  Worsening Chronicity:  New Relieved by:  Nothing Worsened by:  Nothing Ineffective treatments:  None tried Associated symptoms: anorexia, nausea and vomiting   Associated symptoms: no chest pain, no chills, no cough, no diarrhea, no dysuria, no fever and no shortness of breath   Risk factors: obesity        Past Medical History:  Diagnosis Date  . Anxiety   . Chronic back pain   . Gout   . Hypertension     Patient Active Problem List   Diagnosis Date Noted  . Ankle pain 05/17/2012  . Shoulder joint pain 05/17/2012    Past Surgical History:  Procedure Laterality Date  . TONSILLECTOMY         No family history on file.  Social History   Tobacco Use  . Smoking status: Never Smoker  . Smokeless tobacco: Never Used  Vaping Use  . Vaping Use: Never used  Substance Use Topics  . Alcohol use: No  . Drug use: No    Home Medications Prior to Admission medications   Medication Sig Start Date End Date Taking? Authorizing Provider  ALPRAZolam Prudy Feeler) 0.5 MG tablet Take 0.5 mg by mouth 2 (two) times daily. 08/21/19  Yes [provider]  colchicine 0.6 MG tablet Take 0.6 mg by mouth daily.  06/27/19  Yes [provider]  cyclobenzaprine (FLEXERIL) 5 MG tablet Take 5 mg by mouth 2 (two) times daily as needed for muscle spasms.  09/30/19  Yes [provider]  escitalopram (LEXAPRO) 5 MG tablet Take 5 mg by mouth at bedtime. 12/18/19  Yes [provider]  lisinopril-hydrochlorothiazide  (ZESTORETIC) 20-12.5 MG tablet Take 1 tablet by mouth every morning. 07/31/19  Yes [provider]  meloxicam (MOBIC) 15 MG tablet Take 1 tablet (15 mg total) by mouth daily. 08/26/19  Yes Park Liter, DPM  oxyCODONE-acetaminophen (PERCOCET/ROXICET) 5-325 MG per tablet Take 1 tablet by mouth every 8 (eight) hours as needed for severe pain.   Yes [provider]  pantoprazole (PROTONIX) 40 MG tablet Take 40 mg by mouth daily. 12/18/19  Yes [provider]  phentermine (ADIPEX-P) 37.5 MG tablet Take 37.5 mg by mouth daily. 05/30/19  Yes [provider]  Vitamin D, Ergocalciferol, (DRISDOL) 1.25 MG (50000 UNIT) CAPS capsule Take 50,000 Units by mouth every 7 (seven) days.  05/02/19  Yes [provider]  ondansetron (ZOFRAN ODT) 4 MG disintegrating tablet Take 1 tablet (4 mg total) by mouth every 8 (eight) hours as needed for up to 10 doses for nausea or vomiting. 01/04/20   Sabino Donovan, MD  oxyCODONE-acetaminophen (PERCOCET/ROXICET) 5-325 MG tablet Take 1 tablet by mouth every 6 (six) hours as needed for up to 12 days for severe pain. 01/04/20 01/16/20  Sabino Donovan, MD    Allergies    Patient has no known allergies.  Review of Systems   Review of Systems  Constitutional: Negative for chills and fever.  HENT: Negative for congestion and rhinorrhea.  Respiratory: Negative for cough and shortness of breath.   Cardiovascular: Negative for chest pain and palpitations.  Gastrointestinal: Positive for abdominal pain, anorexia, nausea and vomiting. Negative for diarrhea.  Genitourinary: Negative for difficulty urinating and dysuria.  Musculoskeletal: Negative for arthralgias and back pain.  Skin: Negative for color change and rash.  Neurological: Negative for light-headedness and headaches.    Physical Exam Updated Vital Signs BP 122/77   Pulse 96   Temp 99.8 F (37.7 C)   Resp (!) 31   Ht 5\' 9"  (1.753 m)   Wt (!) 192.8 kg   SpO2 95%   BMI 62.76  kg/m   Physical Exam Vitals and nursing note reviewed. Exam conducted with a chaperone present.  Constitutional:      General: He is not in acute distress.    Appearance: Normal appearance.  HENT:     Head: Normocephalic and atraumatic.     Nose: No rhinorrhea.  Eyes:     General:        Right eye: No discharge.        Left eye: No discharge.     Conjunctiva/sclera: Conjunctivae normal.  Cardiovascular:     Rate and Rhythm: Normal rate and regular rhythm.  Pulmonary:     Effort: Pulmonary effort is normal.     Breath sounds: No stridor.  Abdominal:     General: Abdomen is flat. There is no distension.     Palpations: Abdomen is soft.     Tenderness: There is abdominal tenderness in the right lower quadrant, periumbilical area and suprapubic area. There is no guarding or rebound. Negative signs include Murphy's sign, Rovsing's sign, McBurney's sign and psoas sign.  Genitourinary:    Penis: Normal.      Testes: Normal.        Right: Tenderness not present.        Left: Tenderness not present.  Musculoskeletal:        General: No deformity or signs of injury.  Skin:    General: Skin is warm and dry.  Neurological:     General: No focal deficit present.     Mental Status: He is alert. Mental status is at baseline.     Motor: No weakness.  Psychiatric:        Mood and Affect: Mood normal.        Behavior: Behavior normal.        Thought Content: Thought content normal.     ED Results / Procedures / Treatments   Labs (all labs ordered are listed, but only abnormal results are displayed) Labs Reviewed  COMPREHENSIVE METABOLIC PANEL - Abnormal; Notable for the following components:      Result Value   Glucose, Bld 118 (*)    Total Bilirubin 1.5 (*)    All other components within normal limits  CBC - Abnormal; Notable for the following components:   WBC 17.0 (*)    All other components within normal limits  URINALYSIS, ROUTINE W REFLEX MICROSCOPIC - Abnormal; Notable  for the following components:   APPearance HAZY (*)    Protein, ur 30 (*)    Bacteria, UA RARE (*)    All other components within normal limits  CULTURE, BLOOD (SINGLE)  URINE CULTURE  LIPASE, BLOOD  LACTIC ACID, PLASMA  PROTIME-INR  APTT    EKG None  Radiology CT ABDOMEN PELVIS W CONTRAST  Result Date: 01/04/2020 CLINICAL DATA:  39 year old male with acute RIGHT abdominal and pelvic pain. EXAM: CT  ABDOMEN AND PELVIS WITH CONTRAST TECHNIQUE: Multidetector CT imaging of the abdomen and pelvis was performed using the standard protocol following bolus administration of intravenous contrast. CONTRAST:  100mL OMNIPAQUE IOHEXOL 300 MG/ML  SOLN COMPARISON:  12/30/2008 CT FINDINGS: Lower chest: No acute abnormality. Hepatobiliary: Hepatic steatosis noted without suspicious focal hepatic abnormality. Multiple small gallstones are noted without CT evidence of acute cholecystitis. No biliary dilatation. Pancreas: Unremarkable Spleen: Unremarkable Adrenals/Urinary Tract: The kidneys, adrenal glands and bladder are unremarkable. Stomach/Bowel: Mild circumferential wall thickening of the ascending and transverse colon with adjacent mild inflammation compatible with a colitis. No bowel obstruction or pneumoperitoneum. No other bowel wall thickening is identified. A small hiatal hernia is noted. The appendix is unremarkable. Vascular/Lymphatic: No significant vascular findings are present. No enlarged abdominal or pelvic lymph nodes. Reproductive: Prostate is unremarkable. Other: No ascites, focal collection or abdominal wall hernia. Musculoskeletal: No acute or suspicious bony abnormality noted. IMPRESSION: 1. Mild circumferential wall thickening of the ascending and transverse colon with adjacent mild inflammation compatible with colitis. No bowel obstruction, pneumoperitoneum or abscess. 2. Hepatic steatosis. 3. Cholelithiasis without CT evidence of acute cholecystitis. 4. Small hiatal hernia. Electronically  Signed   By: Harmon PierJeffrey  Hu M.D.   On: 01/04/2020 10:18   DG Chest Port 1 View  Result Date: 01/04/2020 CLINICAL DATA:  Questionable sepsis EXAM: PORTABLE CHEST 1 VIEW COMPARISON:  02/16/2017 FINDINGS: Normal heart size and mediastinal contours. No acute infiltrate or edema. No effusion or pneumothorax. No acute osseous findings. IMPRESSION: No active disease. Electronically Signed   By: Marnee SpringJonathon  Watts M.D.   On: 01/04/2020 09:05    Procedures Procedures (including critical care time)  Medications Ordered in ED Medications  lactated ringers infusion ( Intravenous New Bag/Given 01/04/20 0915)  ondansetron (ZOFRAN-ODT) disintegrating tablet 4 mg (4 mg Oral Given 01/04/20 0723)  acetaminophen (TYLENOL) tablet 650 mg (650 mg Oral Given 01/04/20 0730)  lactated ringers bolus 1,000 mL (0 mLs Intravenous Stopped 01/04/20 1108)  morphine 4 MG/ML injection 4 mg (4 mg Intravenous Given 01/04/20 0916)  ondansetron (ZOFRAN) injection 4 mg (4 mg Intravenous Given 01/04/20 0915)  iohexol (OMNIPAQUE) 300 MG/ML solution 100 mL (100 mLs Intravenous Contrast Given 01/04/20 0951)  oxyCODONE-acetaminophen (PERCOCET/ROXICET) 5-325 MG per tablet 2 tablet (2 tablets Oral Given 01/04/20 1258)    ED Course  I have reviewed the triage vital signs and the nursing notes.  Pertinent labs & imaging results that were available during my care of the patient were reviewed by me and considered in my medical decision making (see chart for details).    MDM Rules/Calculators/A&P                          Worsening lower abdominal pain fevers, tachycardia, initial labs done in triage showed leukocytosis otherwise fairly unremarkable after my review. Exam is concerning for possible surgical abdomen, marked obesity makes it difficult to localize the pain. No significant soft tissue infection found. Patient will need CT imaging, will need further labs IV fluids IV pain control. Antiemetics given.  CT scan reviewed by  radiology myself shows cholelithiasis without any acute complications.  Biliary duct is within normal limits, no surrounding inflammatory changes.  He does have signs of colitis, consistent with patient's history of diarrhea nausea vomiting.  Symptoms are much improved with control medications here and patient is tolerating p.o., requesting discharge.  At this time I do not feel he needs further evaluation for this problem, is likely something  over his course of time.  Antibiotics not needed at this time.  I recommend he return with worsening pain or shift of pain to the right upper quadrant.  Patient agrees to this plan strict return precautions are provided  Final Clinical Impression(s) / ED Diagnoses Final diagnoses:  Colitis  Generalized abdominal pain  Fever in adult    Rx / DC Orders ED Discharge Orders         Ordered    ondansetron (ZOFRAN ODT) 4 MG disintegrating tablet  Every 8 hours PRN        01/04/20 1340    oxyCODONE-acetaminophen (PERCOCET/ROXICET) 5-325 MG tablet  Every 6 hours PRN        01/04/20 1340           Sabino Donovan, MD 01/04/20 1342

## 2020-01-04 NOTE — ED Triage Notes (Signed)
C/o abd. Pain onset yest c/o n/v /d

## 2020-01-04 NOTE — ED Notes (Signed)
Pt 1st lactic normal, 2nd to be d/c.

## 2020-01-05 LAB — URINE CULTURE

## 2020-01-09 LAB — CULTURE, BLOOD (SINGLE)
Culture: NO GROWTH
Special Requests: ADEQUATE

## 2020-08-25 ENCOUNTER — Other Ambulatory Visit: Payer: Self-pay

## 2020-08-25 ENCOUNTER — Emergency Department (HOSPITAL_COMMUNITY): Payer: Medicaid Other

## 2020-08-25 ENCOUNTER — Inpatient Hospital Stay (HOSPITAL_COMMUNITY)
Admission: EM | Admit: 2020-08-25 | Discharge: 2020-08-27 | DRG: 062 | Disposition: A | Discharge status: Home / Self Care | Payer: Medicaid Other | Attending: Neurology | Admitting: Neurology

## 2020-08-25 DIAGNOSIS — M545 Low back pain, unspecified: Secondary | ICD-10-CM | POA: Diagnosis present

## 2020-08-25 DIAGNOSIS — R29704 NIHSS score 4: Secondary | ICD-10-CM | POA: Diagnosis not present

## 2020-08-25 DIAGNOSIS — I639 Cerebral infarction, unspecified: Secondary | ICD-10-CM | POA: Diagnosis not present

## 2020-08-25 DIAGNOSIS — F32A Depression, unspecified: Secondary | ICD-10-CM | POA: Diagnosis present

## 2020-08-25 DIAGNOSIS — R519 Headache, unspecified: Secondary | ICD-10-CM | POA: Diagnosis not present

## 2020-08-25 DIAGNOSIS — G8321 Monoplegia of upper limb affecting right dominant side: Secondary | ICD-10-CM | POA: Diagnosis present

## 2020-08-25 DIAGNOSIS — G43409 Hemiplegic migraine, not intractable, without status migrainosus: Secondary | ICD-10-CM | POA: Diagnosis present

## 2020-08-25 DIAGNOSIS — R29705 NIHSS score 5: Secondary | ICD-10-CM | POA: Diagnosis present

## 2020-08-25 DIAGNOSIS — K219 Gastro-esophageal reflux disease without esophagitis: Secondary | ICD-10-CM | POA: Diagnosis present

## 2020-08-25 DIAGNOSIS — G8929 Other chronic pain: Secondary | ICD-10-CM | POA: Diagnosis present

## 2020-08-25 DIAGNOSIS — R29898 Other symptoms and signs involving the musculoskeletal system: Secondary | ICD-10-CM | POA: Diagnosis not present

## 2020-08-25 DIAGNOSIS — Z20822 Contact with and (suspected) exposure to covid-19: Secondary | ICD-10-CM | POA: Diagnosis present

## 2020-08-25 DIAGNOSIS — G43519 Persistent migraine aura without cerebral infarction, intractable, without status migrainosus: Secondary | ICD-10-CM | POA: Diagnosis not present

## 2020-08-25 DIAGNOSIS — Z9114 Patient's other noncompliance with medication regimen: Secondary | ICD-10-CM | POA: Diagnosis not present

## 2020-08-25 DIAGNOSIS — F330 Major depressive disorder, recurrent, mild: Secondary | ICD-10-CM | POA: Diagnosis not present

## 2020-08-25 DIAGNOSIS — Z791 Long term (current) use of non-steroidal anti-inflammatories (NSAID): Secondary | ICD-10-CM | POA: Diagnosis not present

## 2020-08-25 DIAGNOSIS — H538 Other visual disturbances: Secondary | ICD-10-CM | POA: Diagnosis present

## 2020-08-25 DIAGNOSIS — G43411 Hemiplegic migraine, intractable, with status migrainosus: Secondary | ICD-10-CM | POA: Diagnosis not present

## 2020-08-25 DIAGNOSIS — F411 Generalized anxiety disorder: Secondary | ICD-10-CM | POA: Diagnosis not present

## 2020-08-25 DIAGNOSIS — I10 Essential (primary) hypertension: Secondary | ICD-10-CM | POA: Diagnosis present

## 2020-08-25 DIAGNOSIS — K0889 Other specified disorders of teeth and supporting structures: Secondary | ICD-10-CM | POA: Diagnosis present

## 2020-08-25 DIAGNOSIS — F419 Anxiety disorder, unspecified: Secondary | ICD-10-CM | POA: Diagnosis present

## 2020-08-25 DIAGNOSIS — R29810 Facial weakness: Secondary | ICD-10-CM | POA: Diagnosis present

## 2020-08-25 DIAGNOSIS — I6389 Other cerebral infarction: Secondary | ICD-10-CM | POA: Diagnosis not present

## 2020-08-25 DIAGNOSIS — R29701 NIHSS score 1: Secondary | ICD-10-CM | POA: Diagnosis not present

## 2020-08-25 DIAGNOSIS — M109 Gout, unspecified: Secondary | ICD-10-CM | POA: Diagnosis present

## 2020-08-25 DIAGNOSIS — Z79899 Other long term (current) drug therapy: Secondary | ICD-10-CM

## 2020-08-25 DIAGNOSIS — G459 Transient cerebral ischemic attack, unspecified: Secondary | ICD-10-CM | POA: Diagnosis present

## 2020-08-25 DIAGNOSIS — J320 Chronic maxillary sinusitis: Secondary | ICD-10-CM | POA: Diagnosis present

## 2020-08-25 DIAGNOSIS — R29702 NIHSS score 2: Secondary | ICD-10-CM | POA: Diagnosis not present

## 2020-08-25 DIAGNOSIS — I6350 Cerebral infarction due to unspecified occlusion or stenosis of unspecified cerebral artery: Principal | ICD-10-CM | POA: Diagnosis present

## 2020-08-25 DIAGNOSIS — Z6841 Body Mass Index (BMI) 40.0 and over, adult: Secondary | ICD-10-CM

## 2020-08-25 LAB — CBC
HCT: 48.1 % (ref 39.0–52.0)
Hemoglobin: 15.6 g/dL (ref 13.0–17.0)
MCH: 28.5 pg (ref 26.0–34.0)
MCHC: 32.4 g/dL (ref 30.0–36.0)
MCV: 87.9 fL (ref 80.0–100.0)
Platelets: 395 10*3/uL (ref 150–400)
RBC: 5.47 MIL/uL (ref 4.22–5.81)
RDW: 12.9 % (ref 11.5–15.5)
WBC: 12.4 10*3/uL — ABNORMAL HIGH (ref 4.0–10.5)
nRBC: 0 % (ref 0.0–0.2)

## 2020-08-25 LAB — URINALYSIS, ROUTINE W REFLEX MICROSCOPIC
Bilirubin Urine: NEGATIVE
Glucose, UA: NEGATIVE mg/dL
Hgb urine dipstick: NEGATIVE
Ketones, ur: NEGATIVE mg/dL
Leukocytes,Ua: NEGATIVE
Nitrite: NEGATIVE
Protein, ur: NEGATIVE mg/dL
Specific Gravity, Urine: 1.033 — ABNORMAL HIGH (ref 1.005–1.030)
pH: 5 (ref 5.0–8.0)

## 2020-08-25 LAB — RAPID URINE DRUG SCREEN, HOSP PERFORMED
Amphetamines: NOT DETECTED
Barbiturates: NOT DETECTED
Benzodiazepines: NOT DETECTED
Cocaine: NOT DETECTED
Opiates: POSITIVE — AB
Tetrahydrocannabinol: NOT DETECTED

## 2020-08-25 LAB — DIFFERENTIAL
Abs Immature Granulocytes: 0.04 10*3/uL (ref 0.00–0.07)
Basophils Absolute: 0.1 10*3/uL (ref 0.0–0.1)
Basophils Relative: 1 %
Eosinophils Absolute: 0.3 10*3/uL (ref 0.0–0.5)
Eosinophils Relative: 2 %
Immature Granulocytes: 0 %
Lymphocytes Relative: 24 %
Lymphs Abs: 2.9 10*3/uL (ref 0.7–4.0)
Monocytes Absolute: 0.8 10*3/uL (ref 0.1–1.0)
Monocytes Relative: 7 %
Neutro Abs: 8.2 10*3/uL — ABNORMAL HIGH (ref 1.7–7.7)
Neutrophils Relative %: 66 %

## 2020-08-25 LAB — COMPREHENSIVE METABOLIC PANEL
ALT: 24 U/L (ref 0–44)
AST: 17 U/L (ref 15–41)
Albumin: 3.8 g/dL (ref 3.5–5.0)
Alkaline Phosphatase: 69 U/L (ref 38–126)
Anion gap: 11 (ref 5–15)
BUN: 14 mg/dL (ref 6–20)
CO2: 26 mmol/L (ref 22–32)
Calcium: 9.8 mg/dL (ref 8.9–10.3)
Chloride: 103 mmol/L (ref 98–111)
Creatinine, Ser: 1 mg/dL (ref 0.61–1.24)
GFR, Estimated: >60 mL/min (ref >60)
Glucose, Bld: 90 mg/dL (ref 70–99)
Potassium: 4.6 mmol/L (ref 3.5–5.1)
Sodium: 140 mmol/L (ref 135–145)
Total Bilirubin: 0.7 mg/dL (ref 0.3–1.2)
Total Protein: 7.3 g/dL (ref 6.5–8.1)

## 2020-08-25 LAB — I-STAT CHEM 8, ED
BUN: 15 mg/dL (ref 6–20)
Calcium, Ion: 1.16 mmol/L (ref 1.15–1.40)
Chloride: 104 mmol/L (ref 98–111)
Creatinine, Ser: 1 mg/dL (ref 0.61–1.24)
Glucose, Bld: 88 mg/dL (ref 70–99)
HCT: 48 % (ref 39.0–52.0)
Hemoglobin: 16.3 g/dL (ref 13.0–17.0)
Potassium: 4.4 mmol/L (ref 3.5–5.1)
Sodium: 140 mmol/L (ref 135–145)
TCO2: 26 mmol/L (ref 22–32)

## 2020-08-25 LAB — PROTIME-INR
INR: 1 (ref 0.8–1.2)
Prothrombin Time: 13.1 seconds (ref 11.4–15.2)

## 2020-08-25 LAB — LACTIC ACID, PLASMA: Lactic Acid, Venous: 1.4 mmol/L (ref 0.5–1.9)

## 2020-08-25 LAB — ETHANOL: Alcohol, Ethyl (B): <10 mg/dL (ref <10)

## 2020-08-25 LAB — APTT: aPTT: 25 seconds (ref 24–36)

## 2020-08-25 LAB — CBG MONITORING, ED: Glucose-Capillary: 86 mg/dL (ref 70–99)

## 2020-08-25 MED ORDER — ACETAMINOPHEN 650 MG RE SUPP: 650.0000 mg | RECTAL | Status: DC | PRN

## 2020-08-25 MED ORDER — IOHEXOL 350 MG/ML SOLN
100.0000 mL | Freq: Once | INTRAVENOUS | Status: AC | PRN
  Administered 2020-08-25: 100 mL via INTRAVENOUS

## 2020-08-25 MED ORDER — SENNOSIDES-DOCUSATE SODIUM 8.6-50 MG PO TABS: 1.0000 | ORAL_TABLET | Freq: Every evening | ORAL | Status: DC | PRN

## 2020-08-25 MED ORDER — CLEVIDIPINE BUTYRATE 0.5 MG/ML IV EMUL: 0.0000 mg/h | INTRAVENOUS | Status: DC

## 2020-08-25 MED ORDER — HYDROCHLOROTHIAZIDE 12.5 MG PO CAPS
12.5000 mg | ORAL_CAPSULE | Freq: Every day | ORAL | Status: DC
  Administered 2020-08-26 – 2020-08-27 (×2): 12.5 mg via ORAL
  Filled 2020-08-25 (×2): qty 1

## 2020-08-25 MED ORDER — SODIUM CHLORIDE 0.9 % IV SOLN
12.5000 mg | Freq: Once | INTRAVENOUS | Status: AC
  Administered 2020-08-25: 12.5 mg via INTRAVENOUS
  Filled 2020-08-25: qty 0.5

## 2020-08-25 MED ORDER — ALTEPLASE (STROKE) FULL DOSE INFUSION
90.0000 mg | Freq: Once | INTRAVENOUS | Status: AC
  Administered 2020-08-25: 90 mg via INTRAVENOUS

## 2020-08-25 MED ORDER — SODIUM CHLORIDE 0.9 % IV SOLN
50.0000 mL | Freq: Once | INTRAVENOUS | Status: AC
  Administered 2020-08-25: 50 mL via INTRAVENOUS

## 2020-08-25 MED ORDER — STROKE: EARLY STAGES OF RECOVERY BOOK
Freq: Once | Status: AC
  Filled 2020-08-25: qty 1

## 2020-08-25 MED ORDER — ACETAMINOPHEN 160 MG/5ML PO SOLN: 650.0000 mg | ORAL | Status: DC | PRN

## 2020-08-25 MED ORDER — ACETAMINOPHEN 325 MG PO TABS
650.0000 mg | ORAL_TABLET | Freq: Once | ORAL | Status: AC
  Administered 2020-08-25: 650 mg via ORAL
  Filled 2020-08-25: qty 2

## 2020-08-25 MED ORDER — LISINOPRIL-HYDROCHLOROTHIAZIDE 20-12.5 MG PO TABS: 1.0000 | ORAL_TABLET | Freq: Every morning | ORAL | Status: DC

## 2020-08-25 MED ORDER — PANTOPRAZOLE SODIUM 40 MG IV SOLR
40.0000 mg | Freq: Every day | INTRAVENOUS | Status: DC
  Administered 2020-08-25 – 2020-08-26 (×2): 40 mg via INTRAVENOUS
  Filled 2020-08-25 (×2): qty 40

## 2020-08-25 MED ORDER — SODIUM CHLORIDE 0.9 % IV SOLN: INTRAVENOUS | Status: DC

## 2020-08-25 MED ORDER — ACETAMINOPHEN 325 MG PO TABS
650.0000 mg | ORAL_TABLET | ORAL | Status: DC | PRN
  Administered 2020-08-25: 650 mg via ORAL
  Filled 2020-08-25: qty 2

## 2020-08-25 MED ORDER — LISINOPRIL 20 MG PO TABS
20.0000 mg | ORAL_TABLET | Freq: Every day | ORAL | Status: DC
  Administered 2020-08-26 – 2020-08-27 (×2): 20 mg via ORAL
  Filled 2020-08-25 (×2): qty 1

## 2020-08-25 NOTE — H&P (Signed)
NEURO HOSPITALIST ADMISSION HISTORY AND PHYSICAL   Requesting physician: Dr. Fredderick Phenix  Reason for Consult:Acute onset of RUE weakness on a 4 day history of right sided headache and fluctuating vision loss  History obtained from:  EMS and Patient   HPI:                                                                                                                                          BRYDEN DARDEN is an 40 y.o. morbidly obese male with a PMHx of HTN (poorly compliant with medication), anxiety, chronic back pain and gout who presents via EMS as a Code Stroke. LKN 1800. He was driving and started losing strength in his right arm, being unable to move the gear shifter due to the weakness. Also was not feeling well and started feeling disoriented. Drove home and called 911. He did not notice any facial weakness or weakness of his RLE when ambulating. Denies dizziness.  Of note, for the past four days he has had headache and blurred in vision in right eye; the blurry vision waxes and wanes. He also experienced for the first time a sensation of dimming of his right eye vision as though a shade was coming down, when his right arm symptoms started. He states that his headache is in the right periorbital and temporal region, is nonthrobbing and currently rates the pain as 9/10. Some nausea is present as well. He has no history of migraine headache. No prior history of stroke or MI. No history of intracranial lesion. No recent bleeding. No head trauma. No recent surgery.    EMS vitals:  BP 172/98 94 heart rate 96% room air CBG 88  tPA given: Yes NIHSS: 5  Past Medical History:  Diagnosis Date   Anxiety    Chronic back pain    Gout    Hypertension     Past Surgical History:  Procedure Laterality Date   TONSILLECTOMY      No family history on file.           Social History:  reports that he has never smoked. He has never used smokeless tobacco. He reports that he does  not drink alcohol and does not use drugs.  No Known Allergies  HOME MEDICATIONS:  No current facility-administered medications on file prior to encounter.   Current Outpatient Medications on File Prior to Encounter  Medication Sig Dispense Refill   ALPRAZolam (XANAX) 0.5 MG tablet Take 0.5 mg by mouth 2 (two) times daily.     colchicine 0.6 MG tablet Take 0.6 mg by mouth daily.      cyclobenzaprine (FLEXERIL) 5 MG tablet Take 5 mg by mouth 2 (two) times daily as needed for muscle spasms.      escitalopram (LEXAPRO) 5 MG tablet Take 5 mg by mouth at bedtime.     lisinopril-hydrochlorothiazide (ZESTORETIC) 20-12.5 MG tablet Take 1 tablet by mouth every morning.     meloxicam (MOBIC) 15 MG tablet Take 1 tablet (15 mg total) by mouth daily. 30 tablet 0   ondansetron (ZOFRAN ODT) 4 MG disintegrating tablet Take 1 tablet (4 mg total) by mouth every 8 (eight) hours as needed for up to 10 doses for nausea or vomiting. 10 tablet 0   oxyCODONE-acetaminophen (PERCOCET/ROXICET) 5-325 MG per tablet Take 1 tablet by mouth every 8 (eight) hours as needed for severe pain.     pantoprazole (PROTONIX) 40 MG tablet Take 40 mg by mouth daily.     phentermine (ADIPEX-P) 37.5 MG tablet Take 37.5 mg by mouth daily.     Vitamin D, Ergocalciferol, (DRISDOL) 1.25 MG (50000 UNIT) CAPS capsule Take 50,000 Units by mouth every 7 (seven) days.        ROS:                                                                                                                                       As per HPI. Comprehensive ROS otherwise negative.   Blood pressure (!) 158/120, pulse 100, resp. rate 20, weight (!) 183 kg, SpO2 100 %.  General Examination:                                                                                                       Physical Exam  General: Supermorbid obesity noted HEENT-   [place]/AT   Lungs- Respirations unlabored Abdomen- Large pannus noted Extremities- No edema  Neurological Examination Mental Status: Awake and alert. Oriented x 5. Speech fluent with intact naming and comprehension. Able to follow all commands without difficulty. Cranial Nerves: II: Visual fields intact in all 4 quadrants of left eye. Right eye with constricted visual fields all 4 quadrants and preserved central vision. Visual acuity: 20/100 OD, 20/50 OS.  PERRL.  III,IV, VI: No  ptosis. EOM with intermittent difficulty initiating pursuits and saccades from midline, but can fully gaze to right and left, with no impairment of vertical gaze. Intermittent exotropia also noted, with right eye movement lagging left. No nystagmus.  V,VII: Subtle weakness of right lower quadrant of face. Temp sensation equal bilaterally.  VIII: Hearing intact to voice IX,X: No hypophonia XI: Head is midline XII: Midline tongue extension Motor: RUE 4/5 proximally and distally RLE 4+/5 LUE and LLE 5/5 Drift noted to RUE during testing of [place] Sensory: Temp and light touch sensation subjectively intact to BUE and BLE One instance of extinction to DSS on the right when testing upper extremities.  Deep Tendon Reflexes: 2+ and symmetric throughout Plantars: Right: downgoing   Left: downgoing Cerebellar: No ataxia with FNF bilaterally. H-S intact bilaterally Gait: Deferred   Lab Results: Basic Metabolic Panel: No results for input(s): NA, K, CL, CO2, GLUCOSE, BUN, CREATININE, CALCIUM, MG, PHOS in the last 168 hours.  CBC: No results for input(s): WBC, NEUTROABS, HGB, HCT, MCV, PLT in the last 168 hours.  Cardiac Enzymes: No results for input(s): CKTOTAL, CKMB, CKMBINDEX, TROPONINI in the last 168 hours.  Lipid Panel: No results for input(s): CHOL, TRIG, HDL, CHOLHDL, VLDL, LDLCALC in the last 168 hours.  Imaging: No results found.  Assessment: 40 year old supermorbidly obese male presenting with acute onset  of right sided weakness.  1. Exam reveals right motor weakness and extinction to DSS on the right, in addition to right facial droop, vision impairment OD and ocular motility deficit.  2. Most likely etiology for the patient's presentation is acute ischemic stroke, localizable to left cerebral hemisphere 3. CT head: There is no acute intracranial hemorrhage or evidence of acute infarction. ASPECT score is 10. 4. CTA of head and neck: No occlusion or hemodynamically significant stenosis in the neck. No evidence of core infarction on perfusion imaging. Small caliber non dominant left vertebral artery. There is diminished flow intracranially possibly secondary to stenosis at dural margin. A small 4 mm area of calculated territory at risk on perfusion imaging within the inferior left cerebellum may be related to this. 5. After comprehensive review of possible contraindications, she has no absolute contraindications to tPA administration. 6. Patient is a tPA candidate. Discussed extensively the risks/benefits of tPA treatment vs. no treatment with the patient, including risks of hemorrhage and death with tPA administration versus worse overall outcomes on average in patients within tPA time window who are not administered tPA. Overall benefits of tPA regarding long-term prognosis are felt to outweigh risks. The patient expressed understanding and wish to proceed with tPA  Recommendations: 1. Admitting to Neuro ICU.  2. Post-tPA order set to include frequent neuro checks and BP management.  3. No antiplatelet medications or anticoagulants for at least 24 hours following tPA.  4. DVT prophylaxis with SCDs.  5. Will need to be started on a statin.  6. Will need to be started on ASA if follow up CT at 24 hours is negative for hemorrhagic conversion. 7. Cardiac telemetry.  8. TTE.  9. MRI brain 10. PT/OT/Speech.  11. NPO until passes swallow evaluation.  12. Fasting lipid panel, HgbA1c 13. Will need  Ophthalmology consult in the AM for dilated retinal exam OD.     Electronically signed: Dr. Caryl Pina 08/25/2020, 7:52 PM

## 2020-08-25 NOTE — ED Notes (Signed)
Pt had back, top right tooth pulled a few days ago. Some bleeding noted from cavity. Pt placed gauze and applying pressure to the area. Neuro status unchanged, complaints of headache. No other complaints at this time.

## 2020-08-25 NOTE — ED Notes (Signed)
CBG of 86

## 2020-08-25 NOTE — ED Notes (Signed)
Pt complaining that headache is getting worse. Neurologist made aware.

## 2020-08-25 NOTE — Code Documentation (Signed)
Stroke Response Nurse Documentation Code Documentation  KEYANDRE PILEGGI is a 40 y.o. male arriving to [facility] ED via [facility] on 7/12 with past medical hx of HTN, Gout. Code stroke was activated by EMS. Patient from Home where he was LKW at 1800 and now complaining of Right arm weakness and blurred vision . On No antithrombotic. Stroke team at the bedside on patient arrival. Labs drawn and patient cleared for CT by Dr. Stevie Kern. Patient to CT with team. NIHSS 5, see documentation for details and code stroke times. Patient with right hemianopia, right facial droop, right arm weakness, right leg weakness, and Sensory  neglect on exam. The following imaging was completed:  CTP. Patient is a candidate for tPA due to fixed neurological deficit. Bedside handoff with ED RN Jon Gills.    Rose Fillers  Rapid Response RN

## 2020-08-25 NOTE — ED Provider Notes (Signed)
Patient during my evaluation. Craig [name] Provider Note   CSN: [id number] Arrival date & time: 08/25/20  1934  An emergency [name] physician performed an initial assessment on this suspected stroke patient at 53.  History Chief Complaint  Patient presents with   Code Stroke    Craig [name] is a 40 y.o. male.  HPI Patient seen as a code stroke  Patient seen at the bridge with neurology Dr. Otelia Limes.  Patient was last seen normal at 1800.  Per EMS patient has had right grip strength weakness and had a moment of confusion read about 1800.  Since that time he has had persistent right arm weakness.  EMS noted that he had some right arm drift.  For the past 2 days patient has had intermittent headaches.  Also seems to have had some right blurry vision.  Denies any recent head trauma.  No history of intracranial tumors.  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  Social History   Tobacco Use   Smoking status: Never   Smokeless tobacco: Never  Vaping Use   Vaping Use: Never used  Substance Use Topics   Alcohol use: No   Drug use: No    Home Medications Prior to Admission medications   Medication Sig Start Date End Date Taking? Authorizing Provider  lisinopril-hydrochlorothiazide (ZESTORETIC) 20-12.5 MG tablet Take 1 tablet by mouth every morning. 07/31/19  Yes [provider]  oxyCODONE-acetaminophen (PERCOCET/ROXICET) 5-325 MG per tablet Take 1 tablet by mouth every 8 (eight) hours as needed for severe pain.   Yes  [provider]    Allergies    Patient has no known allergies.  Review of Systems   Review of Systems  Constitutional:  Negative for chills and fever.  HENT:  Negative for congestion.   Eyes:  Negative for pain.  Respiratory:  Negative for cough and shortness of breath.   Cardiovascular:  Negative for chest pain and leg swelling.  Gastrointestinal:  Negative for abdominal pain and vomiting.  Genitourinary:  Negative for dysuria.  Musculoskeletal:  Negative for myalgias.  Skin:  Negative for rash.  Neurological:  Positive for weakness. Negative for dizziness and headaches.  Psychiatric/Behavioral:  Positive for confusion (Improved).    Physical Exam Updated Vital Signs BP (!) 146/85   Pulse 83   Temp 99.3 F (37.4 C)   Resp (!) 33   Wt (!) 183 kg   SpO2 96%   BMI 59.58 kg/m   Physical Exam Vitals and nursing note reviewed.  Constitutional:      General: He is not in acute distress. HENT:     Head: Normocephalic and atraumatic.     Nose: Nose normal.     Mouth/Throat:     Mouth: Mucous membranes are moist.  Eyes:     General: No scleral icterus. Cardiovascular:     Rate and Rhythm: Normal rate and regular rhythm.     Pulses: Normal pulses.  Heart sounds: Normal heart sounds.  Pulmonary:     Effort: Pulmonary effort is normal. No respiratory distress.     Breath sounds: No wheezing.  Abdominal:     Palpations: Abdomen is soft.     Tenderness: There is no abdominal tenderness.  Musculoskeletal:     Cervical back: Normal range of motion.     Right lower leg: No edema.     Left lower leg: No edema.  Skin:    General: Skin is warm and dry.     Capillary Refill: Capillary refill takes less than 2 seconds.  Neurological:     Mental Status: He is alert.     Comments: RUE weakness RLE weakness  Psychiatric:        Mood and Affect: Mood normal.    ED Results / Procedures / Treatments   Labs (all labs ordered are listed, but only abnormal results  are displayed) Labs Reviewed  CBC - Abnormal; Notable for the following components:      Result Value   WBC 12.4 (*)    All other components within normal limits  DIFFERENTIAL - Abnormal; Notable for the following components:   Neutro Abs 8.2 (*)    All other components within normal limits  RAPID URINE DRUG SCREEN, HOSP PERFORMED - Abnormal; Notable for the following components:   Opiates POSITIVE (*)    All other components within normal limits  URINALYSIS, ROUTINE W REFLEX MICROSCOPIC - Abnormal; Notable for the following components:   Specific Gravity, Urine 1.033 (*)    All other components within normal limits  RESP PANEL BY RT-PCR (FLU A&B, COVID) ARPGX2  ETHANOL  PROTIME-INR  APTT  COMPREHENSIVE METABOLIC PANEL  LACTIC ACID, PLASMA  HIV ANTIBODY (ROUTINE TESTING W REFLEX)  HEMOGLOBIN A1C  LIPID PANEL  I-STAT CHEM 8, ED  CBG MONITORING, ED    EKG None  Radiology CT CEREBRAL PERFUSION W CONTRAST  Result Date: 08/25/2020 CLINICAL DATA:  Right arm weakness EXAM: CT ANGIOGRAPHY HEAD AND NECK CT PERFUSION BRAIN TECHNIQUE: Multidetector CT imaging of the head and neck was performed using the standard protocol during bolus administration of intravenous contrast. Multiplanar CT image reconstructions and MIPs were obtained to evaluate the vascular anatomy. Carotid stenosis measurements (when applicable) are obtained utilizing NASCET criteria, using the distal internal carotid diameter as the denominator. Multiphase CT imaging of the brain was performed following IV bolus contrast injection. Subsequent parametric perfusion maps were calculated using RAPID software. CONTRAST:  OMNIPAQUE IOHEXOL 350 MG/ML SOLN COMPARISON:  None. FINDINGS: CTA NECK Aortic arch: Great vessel origins are patent. Right carotid system: Patent.  No stenosis. Left carotid system: Patent. No stenosis. Partially retropharyngeal course. Vertebral arteries: Dominant right vertebral artery is patent. 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 Skeleton: No significant abnormality. Other neck: Unremarkable. Upper chest: Unremarkable. Review of the MIP images confirms the above findings CTA HEAD Anterior circulation: Intracranial internal carotid arteries are patent. Anterior and middle cerebral arteries are patent. Posterior circulation: Intracranial right vertebral artery is patent. There is diminished opacification of the intracranial left vertebral artery, which could terminate as a PICA. May reflect stenosis at the V3-V4 junction. Basilar artery is patent. Major cerebellar artery origins are patent. Posterior cerebral arteries are patent. Venous sinuses: Patent as allowed by contrast bolus timing. Review of the MIP images confirms the above findings CT Brain Perfusion Findings: CBF (<30%) Volume: 0mL Perfusion (Tmax>6.0s) volume: 4mL Mismatch Volume: 4mL Infarction Location: Area of calculated territory at risk  is within the inferior left cerebellum. IMPRESSION: No occlusion or hemodynamically significant stenosis in the neck. No evidence of core infarction on perfusion imaging. Small caliber non dominant left vertebral artery. There is diminished flow intracranially possibly secondary to stenosis at dural margin. A small 4 mm area of calculated territory at risk on perfusion imaging within the inferior left cerebellum may be related to this. Initial results were provided by telephone at the time of interpretation on 08/25/2020 at 8:40 pm to provider ERIC [facility] , who verbally acknowledged these results. Electronically Signed   By: Guadlupe Spanish M.D.   On: 08/25/2020 20:44   CT HEAD CODE STROKE WO CONTRAST  Result Date: 08/25/2020 CLINICAL DATA:  Code stroke.  Right arm weakness EXAM: CT HEAD WITHOUT CONTRAST TECHNIQUE: Contiguous axial images were obtained from the base of the skull through the vertex without intravenous contrast. COMPARISON:  None. FINDINGS: Brain: There is no acute intracranial  hemorrhage, mass effect, or edema. Gray-white differentiation is preserved. Ventricles and sulci are size configuration there is no extra-axial collection. Vascular: No hyperdense vessel. Skull: Unremarkable. Sinuses/Orbits: No acute abnormality. Other: Mastoid air cells are clear. ASPECTS (Alberta Stroke Program Early CT Score) - Ganglionic level infarction (caudate, lentiform nuclei, internal capsule, insula, M1-M3 cortex): 7 - Supraganglionic infarction (M4-M6 cortex): 3 Total score (0-10 with 10 being normal): 10 IMPRESSION: There is no acute intracranial hemorrhage or evidence of acute infarction. ASPECT score is 10. Initial results were called by telephone at the time of interpretation on 08/25/2020 at 7:56 pm to provider Dr. Otelia Limes, who verbally acknowledged these results. Electronically Signed   By: Guadlupe Spanish M.D.   On: 08/25/2020 19:58   CT ANGIO HEAD CODE STROKE  Result Date: 08/25/2020 CLINICAL DATA:  Right arm weakness EXAM: CT ANGIOGRAPHY HEAD AND NECK CT PERFUSION BRAIN TECHNIQUE: Multidetector CT imaging of the head and neck was performed using the standard protocol during bolus administration of intravenous contrast. Multiplanar CT image reconstructions and MIPs were obtained to evaluate the vascular anatomy. Carotid stenosis measurements (when applicable) are obtained utilizing NASCET criteria, using the distal internal carotid diameter as the denominator. Multiphase CT imaging of the brain was performed following IV bolus contrast injection. Subsequent parametric perfusion maps were calculated using RAPID software. CONTRAST:  OMNIPAQUE IOHEXOL 350 MG/ML SOLN COMPARISON:  None. FINDINGS: CTA NECK Aortic arch: Great vessel origins are patent. Right carotid system: Patent.  No stenosis. Left carotid system: Patent. No stenosis. Partially retropharyngeal course. Vertebral arteries: Dominant right vertebral artery is patent. 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  Skeleton: No significant abnormality. Other neck: Unremarkable. Upper chest: Unremarkable. Review of the MIP images confirms the above findings CTA HEAD Anterior circulation: Intracranial internal carotid arteries are patent. Anterior and middle cerebral arteries are patent. Posterior circulation: Intracranial right vertebral artery is patent. There is diminished opacification of the intracranial left vertebral artery, which could terminate as a PICA. May reflect stenosis at the V3-V4 junction. Basilar artery is patent. Major cerebellar artery origins are patent. Posterior cerebral arteries are patent. Venous sinuses: Patent as allowed by contrast bolus timing. Review of the MIP images confirms the above findings CT Brain Perfusion Findings: CBF (<30%) Volume: 61mL Perfusion (Tmax>6.0s) volume: 65mL Mismatch Volume: 36mL Infarction Location: Area of calculated territory at risk is within the inferior left cerebellum. IMPRESSION: No occlusion or hemodynamically significant stenosis in the neck. No evidence of core infarction on perfusion imaging. Small caliber non dominant left vertebral artery. There is diminished flow intracranially  possibly secondary to stenosis at dural margin. A small 4 mm area of calculated territory at risk on perfusion imaging within the inferior left cerebellum may be related to this. Initial results were provided by telephone at the time of interpretation on 08/25/2020 at 8:40 pm [facility] , who verbally acknowledged these results. Electronically Signed   By: Guadlupe SpanishPraneil  Patel M.D.   On: 08/25/2020 20:44   CT ANGIO NECK CODE STROKE  Result Date: 08/25/2020 CLINICAL DATA:  Right arm weakness EXAM: CT ANGIOGRAPHY HEAD AND NECK CT PERFUSION BRAIN TECHNIQUE: Multidetector CT imaging of the head and neck was performed using the standard protocol during bolus administration of intravenous contrast. Multiplanar CT image reconstructions and MIPs were obtained to evaluate the vascular  anatomy. Carotid stenosis measurements (when applicable) are obtained utilizing NASCET criteria, using the distal internal carotid diameter as the denominator. Multiphase CT imaging of the brain was performed following IV bolus contrast injection. Subsequent parametric perfusion maps were calculated using RAPID software. CONTRAST:  100mL OMNIPAQUE IOHEXOL 350 MG/ML SOLN COMPARISON:  None. FINDINGS: CTA NECK Aortic arch: Great vessel origins are patent. Right carotid system: Patent.  No stenosis. Left carotid system: Patent. No stenosis. Partially retropharyngeal course. Vertebral arteries: Dominant right vertebral artery is patent. 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 Skeleton: No significant abnormality. Other neck: Unremarkable. Upper chest: Unremarkable. Review of the MIP images confirms the above findings CTA HEAD Anterior circulation: Intracranial internal carotid arteries are patent. Anterior and middle cerebral arteries are patent. Posterior circulation: Intracranial right vertebral artery is patent. There is diminished opacification of the intracranial left vertebral artery, which could terminate as a PICA. May reflect stenosis at the V3-V4 junction. Basilar artery is patent. Major cerebellar artery origins are patent. Posterior cerebral arteries are patent. Venous sinuses: Patent as allowed by contrast bolus timing. Review of the MIP images confirms the above findings CT Brain Perfusion Findings: CBF (<30%) Volume: 0mL Perfusion (Tmax>6.0s) volume: 4mL Mismatch Volume: 4mL Infarction Location: Area of calculated territory at risk is within the inferior left cerebellum. IMPRESSION: No occlusion or hemodynamically significant stenosis in the neck. No evidence of core infarction on perfusion imaging. Small caliber non dominant left vertebral artery. There is diminished flow intracranially possibly secondary to stenosis at dural margin. A small 4 mm area of calculated territory at risk on  perfusion imaging within the inferior left cerebellum may be related to this. Initial results were provided by telephone at the time of interpretation on 08/25/2020 at 8:40 pm to provider [facility] , who verbally acknowledged these results. Electronically Signed   By: Guadlupe SpanishPraneil  Patel M.D.   On: 08/25/2020 20:44    Procedures .Critical Care  Date/Time: 08/25/2020 8:48 PM Performed by: Gailen ShelterFondaw, Jori Thrall S, PA Authorized by: Gailen ShelterFondaw, Ein Rijo S, PA   Critical care provider statement:    Critical care time (minutes):  35   Critical care time was exclusive of:  Separately billable procedures and treating other patients and teaching time   Critical care was necessary to treat or prevent imminent or life-threatening deterioration of the following conditions: stroke.   Critical care was time spent personally by me on the following activities:  Discussions with consultants, evaluation of patient's response to treatment, examination of patient, review of old charts, re-evaluation of patient's condition, pulse oximetry, ordering and review of radiographic studies, ordering and review of laboratory studies and ordering and performing treatments and interventions   I assumed direction of critical care for this patient from another  provider in my specialty: no     Medications Ordered in ED Medications  0.9 %  sodium chloride infusion ( Intravenous New Bag/Given 08/25/20 2254)  acetaminophen (TYLENOL) tablet 650 mg (650 mg Oral Given 08/25/20 2331)    Or  acetaminophen (TYLENOL) 160 MG/5ML solution 650 mg ( Per Tube See Alternative 08/25/20 2331)    Or  acetaminophen (TYLENOL) suppository 650 mg ( Rectal See Alternative 08/25/20 2331)  senna-docusate (Senokot-S) tablet 1 tablet (has no administration in time range)  pantoprazole (PROTONIX) injection 40 mg (40 mg Intravenous Given 08/25/20 2254)  clevidipine (CLEVIPREX) infusion 0.5 mg/mL (0 mg/hr Intravenous Not Given 08/25/20 2328)  lisinopril (ZESTRIL) tablet 20  mg (has no administration in time range)  hydrochlorothiazide (MICROZIDE) capsule 12.5 mg (has no administration in time range)  alteplase (ACTIVASE) 1 mg/mL infusion SOLN 90 mg (0 mg Intravenous Stopped 08/25/20 2111)    Followed by  0.9 %  sodium chloride infusion (0 mLs Intravenous Stopped 08/25/20 2230)  promethazine (PHENERGAN) 12.5 mg in sodium chloride 0.9 % 50 mL IVPB (0 mg Intravenous Stopped 08/25/20 2059)  iohexol (OMNIPAQUE) 350 MG/ML injection 100 mL (100 mLs Intravenous Contrast Given 08/25/20 2023)  acetaminophen (TYLENOL) tablet 650 mg (650 mg Oral Given 08/25/20 2134)   stroke: mapping our early stages of recovery book ( Does not apply Given 08/25/20 2330)    ED Course  I have reviewed the triage vital signs and the nursing notes.  Pertinent labs & imaging results that were available during my care of the patient were reviewed by me and considered in my medical decision making (see chart for details).  Clinical Course as of 08/26/20 0017  Tue Aug 25, 2020  1946 Patient seen at the bridge with neurology Dr. Otelia Limes.  Patient was last seen normal at 1800.  Per EMS patient has had right grip strength weakness and had a moment of confusion read about 1800.  Since that time he has had persistent right arm weakness.  EMS noted that he had some right arm drift.  For the past 2 days patient has had intermittent headaches.  Also seems to have had some right blurry vision.  Airway intact.  [WF]    Clinical Course User Index [WF] Gailen Shelter, [place]   MDM Rules/Calculators/A&P                          Pt w infarct per neurology.  No LVO  Patient given tPA.  Admitted to neuro ICU by Dr. Bufford Lope.  I-STAT Chem-8 without abnormality.  Urinalysis without any evidence of infection.  Coags within normal limits.  CBC with mild leukocytosis.  No anemia.  CMP unremarkable.  Urine drug screen positive for opiates.  COVID and influenza negative.  Lactic within normal limits.  Patient  moved off the floor.  Is now in ICU.  Just prior to moved to ICU patient was symptomatically improving.  Final Clinical Impression(s) / ED Diagnoses Final diagnoses:  Cerebrovascular accident (CVA), unspecified mechanism (HCC)  Right arm weakness    Rx / DC Orders ED Discharge Orders     None        Gailen Shelter, PA 08/26/20 [id number]    Rolan Bucco, MD 08/31/20 0710

## 2020-08-25 NOTE — ED Notes (Signed)
RN aware of pts O2 levels 

## 2020-08-25 NOTE — ED Notes (Signed)
Pt standing up in room using urinal at this time. Brother is at bedside.

## 2020-08-25 NOTE — ED Triage Notes (Signed)
BIB [facility] from home. LKW 1800. He was driving and started loosing strength in right arm. Was Not feeling well and started feeling disoriented. Drove home and called 911. For the past four days has had headache and blurred in vision in right eye. Slight right arm drift and weakness.   172/98 94 heart rate 96% room air CBG 88

## 2020-08-25 NOTE — ED Notes (Signed)
Verbal order per Otelia Limes, MD to give Labetalol 10mg  IV. Pharmacy at bedside, prepared meds. RN to give.   Given at 2005

## 2020-08-25 NOTE — Progress Notes (Signed)
PHARMACIST CODE STROKE RESPONSE  Notified to mix tPA at 2003 by Dr. Otelia Limes. DBP above cut-off 2009. Repeat BP measured and ok for tpa. Delivered tPA to RN at 2012.  tPA dose = 9mg  bolus over 1 minute followed by 81mg  for a total dose of 90mg  over 1 hour  Issues/delays encountered (if applicable): Blood pressure control  08/25/20 8:19 PM

## 2020-08-25 NOTE — ED Notes (Signed)
MD paged and secured chat about the need for a bed request.

## 2020-08-26 ENCOUNTER — Inpatient Hospital Stay (HOSPITAL_COMMUNITY): Payer: Medicaid Other

## 2020-08-26 DIAGNOSIS — I6389 Other cerebral infarction: Secondary | ICD-10-CM

## 2020-08-26 LAB — LIPID PANEL
Cholesterol: 170 mg/dL (ref 0–200)
HDL: 33 mg/dL — ABNORMAL LOW (ref >40)
LDL Cholesterol: 111 mg/dL — ABNORMAL HIGH (ref 0–99)
Total CHOL/HDL Ratio: 5.2 RATIO
Triglycerides: 129 mg/dL (ref <150)
VLDL: 26 mg/dL (ref 0–40)

## 2020-08-26 LAB — HEMOGLOBIN A1C
Hgb A1c MFr Bld: 5.4 % (ref 4.8–5.6)
Mean Plasma Glucose: 108.28 mg/dL

## 2020-08-26 LAB — ECHOCARDIOGRAM COMPLETE
Area-P 1/2: 2.83 cm2
S' Lateral: 3.4 cm
Weight: 6455.07 oz

## 2020-08-26 LAB — RESP PANEL BY RT-PCR (FLU A&B, COVID) ARPGX2
Influenza A by PCR: NEGATIVE
Influenza B by PCR: NEGATIVE
SARS Coronavirus 2 by RT PCR: NEGATIVE

## 2020-08-26 LAB — HIV ANTIBODY (ROUTINE TESTING W REFLEX): HIV Screen 4th Generation wRfx: NONREACTIVE

## 2020-08-26 LAB — MRSA NEXT GEN BY PCR, NASAL: MRSA by PCR Next Gen: NOT DETECTED

## 2020-08-26 MED ORDER — PERFLUTREN LIPID MICROSPHERE
1.0000 mL | INTRAVENOUS | Status: AC | PRN
  Filled 2020-08-26: qty 10

## 2020-08-26 MED ORDER — FAMOTIDINE 20 MG PO TABS
20.0000 mg | ORAL_TABLET | Freq: Two times a day (BID) | ORAL | Status: DC
  Administered 2020-08-26 – 2020-08-27 (×2): 20 mg via ORAL
  Filled 2020-08-26 (×2): qty 1

## 2020-08-26 MED ORDER — ALPRAZOLAM 0.5 MG PO TABS
1.5000 mg | ORAL_TABLET | Freq: Once | ORAL | Status: AC
  Administered 2020-08-26: 1.5 mg via ORAL
  Filled 2020-08-26: qty 3

## 2020-08-26 MED ORDER — OXYCODONE-ACETAMINOPHEN 5-325 MG PO TABS
1.0000 | ORAL_TABLET | Freq: Four times a day (QID) | ORAL | Status: DC | PRN
  Administered 2020-08-26 – 2020-08-27 (×5): 1 via ORAL
  Filled 2020-08-26 (×5): qty 1

## 2020-08-26 MED ORDER — ESCITALOPRAM OXALATE 10 MG PO TABS
20.0000 mg | ORAL_TABLET | Freq: Every day | ORAL | Status: DC
  Filled 2020-08-26: qty 2

## 2020-08-26 MED ORDER — ATORVASTATIN CALCIUM 40 MG PO TABS
40.0000 mg | ORAL_TABLET | Freq: Every day | ORAL | Status: DC
  Administered 2020-08-27: 40 mg via ORAL
  Filled 2020-08-26: qty 1

## 2020-08-26 MED ORDER — LORAZEPAM 2 MG/ML IJ SOLN
INTRAMUSCULAR | Status: AC
  Administered 2020-08-26: 1 mg via INTRAVENOUS
  Filled 2020-08-26: qty 1

## 2020-08-26 MED ORDER — PERFLUTREN LIPID MICROSPHERE
INTRAVENOUS | Status: AC
  Administered 2020-08-26: 2 mL via INTRAVENOUS
  Filled 2020-08-26: qty 10

## 2020-08-26 MED ORDER — OXYCODONE-ACETAMINOPHEN 7.5-325 MG PO TABS
1.0000 | ORAL_TABLET | Freq: Four times a day (QID) | ORAL | Status: DC | PRN
Start: 2020-08-26 — End: 2020-08-26

## 2020-08-26 MED ORDER — CHLORHEXIDINE GLUCONATE CLOTH 2 % EX PADS
6.0000 | MEDICATED_PAD | Freq: Every day | CUTANEOUS | Status: DC
  Administered 2020-08-26 – 2020-08-27 (×2): 6 via TOPICAL

## 2020-08-26 MED ORDER — OXYCODONE HCL 5 MG PO TABS
2.5000 mg | ORAL_TABLET | Freq: Four times a day (QID) | ORAL | Status: DC | PRN
  Administered 2020-08-26 – 2020-08-27 (×5): 2.5 mg via ORAL
  Filled 2020-08-26 (×5): qty 1

## 2020-08-26 MED ORDER — LORAZEPAM 2 MG/ML IJ SOLN: 1.0000 mg | Freq: Once | INTRAMUSCULAR | Status: AC

## 2020-08-26 MED ORDER — ALPRAZOLAM 0.5 MG PO TABS
0.5000 mg | ORAL_TABLET | Freq: Two times a day (BID) | ORAL | Status: DC
  Administered 2020-08-26 – 2020-08-27 (×2): 0.5 mg via ORAL
  Filled 2020-08-26 (×2): qty 1

## 2020-08-26 NOTE — Progress Notes (Signed)
PT Cancellation Note  Patient Details Name: Craig [name] MRN: [id number] DOB: 1980-06-27   Cancelled Treatment:    Reason Eval/Treat Not Completed: Active bedrest order  Lillia Pauls, PT, DPT Acute Rehabilitation Services Pager [phone] Office [phone]    Norval Morton 08/26/2020, 7:45 AM

## 2020-08-26 NOTE — Evaluation (Signed)
Physical Therapy Evaluation Only Patient Details Name: Craig [name] MRN: [id number] DOB: 02-17-1980 Today's Date: 08/26/2020   History of Present Illness  Pt is a 40 y.o. male who presented 7/12 with acute onset of RUE weakness on a 4 day history of right sided headache and fluctuating vision loss. Received tPA 7/12. CT head: diminished flow intracranially ? stenosis at dural margin, small 4 mm area of calculated territory at risk on perfusion imaging within the inferior left cerebellum. PMHx: poorly controlled HTN   Clinical Impression  Pt presents with condition above. PTA, he was independent and living alone in a mobile home with 4 STE with bil handrails. He appears to be back at his baseline, not needing any physical assistance for all functional mobility, including stairs. He is not at risk for falls. In addition, his strength, coordination, and sensation appear intact and WNL in his bil lower extremities. All education completed and questions answered. No further PT services needed, will sign off.    Follow Up Recommendations No PT follow up    Equipment Recommendations  None recommended by PT    Recommendations for Other Services       Precautions / Restrictions Precautions Precautions: None Restrictions Weight Bearing Restrictions: No      Mobility  Bed Mobility Overal bed mobility: Independent             General bed mobility comments: Pt standing with OT upon arrival.    Transfers Overall transfer level: Independent Equipment used: None             General transfer comment: Pt able to transfer to stand safely without LOB in appropriate amount of time.  Ambulation/Gait Ambulation/Gait assistance: Supervision Gait Distance (Feet): 200 Feet Assistive device: None Gait Pattern/deviations: WFL(Within Functional Limits) Gait velocity: WNL Gait velocity interpretation: >4.37 ft/sec, indicative of normal walking speed General Gait Details: No asymmetric  or significant gait deviations. Maintains a fairly wide BOS, but likely his baseline. No LOB, even with challenges from DGI. Supervision for safety.  Stairs Stairs: Yes Stairs assistance: Supervision Stair Management: One rail Right;One rail Left;Alternating pattern;Step to pattern;Forwards Number of Stairs: 10 General stair comments: Ascends with R rail and reciprocal pattern and descends with L rail and step-to pattern, watching his feet. Reports he does this at baseline. No LOB, supervision for safety.  Wheelchair Mobility    Modified Rankin (Stroke Patients Only) Modified Rankin (Stroke Patients Only) Pre-Morbid Rankin Score: No symptoms Modified Rankin: No symptoms     Balance Overall balance assessment: Independent                               Standardized Balance Assessment Standardized Balance Assessment : Dynamic Gait Index   Dynamic Gait Index Level Surface: Normal Change in Gait Speed: Normal Gait with Horizontal Head Turns: Normal Gait with Vertical Head Turns: Normal Gait and Pivot Turn: Normal Step Over Obstacle: Mild Impairment Step Around Obstacles: Normal Steps: Moderate Impairment Total Score: 21       Pertinent Vitals/Pain Pain Assessment: 0-10 Pain Score: 2  Pain Location: Headache Pain Descriptors / Indicators: Headache Pain Intervention(s): Limited activity within patient's tolerance;Monitored during session;Repositioned    Home Living Family/patient expects to be discharged to:: Private residence Living Arrangements: Alone Available Help at Discharge: Family;Friend(s);Available PRN/intermittently Type of Home: Mobile home Home Access: Stairs to enter Entrance Stairs-Rails: Can reach both Entrance Stairs-Number of Steps: 4 Home Layout: One level Home Equipment:  Grab bars - tub/shower Additional Comments: mother lives 3 houses down    Prior Function Level of Independence: Independent               Hand Dominance    Dominant Hand: Right    Extremity/Trunk Assessment   Upper Extremity Assessment Upper Extremity Assessment: Defer to OT evaluation RUE Deficits / Details: strength 5/5 RUE Sensation: WNL RUE Coordination: WNL LUE Deficits / Details: strength 5/5 LUE Sensation: WNL LUE Coordination: WNL    Lower Extremity Assessment Lower Extremity Assessment: Overall WFL for tasks assessed (bil 5/5 MMT; bil sensation intact; bil coordination intact)    Cervical / Trunk Assessment Cervical / Trunk Assessment: Normal;Other exceptions Cervical / Trunk Exceptions: large body habitus  Communication   Communication: No difficulties  Cognition Arousal/Alertness: Awake/alert Behavior During Therapy: WFL for tasks assessed/performed Overall Cognitive Status: Within Functional Limits for tasks assessed                                 General Comments: Pt reading for accuracy of visual scanning and pt skipping over large words and stating a similar word; pt reports "I can't read that good." Pt reading from his phone in diffierent fonts/sizes without difficulty. Pt stating all 3/3 ADL items and able to use fo intended purpose.      General Comments General comments (skin integrity, edema, etc.): pt's family in room.    Exercises     Assessment/Plan    PT Assessment Patent does not need any further PT services  PT Problem List         PT Treatment Interventions      PT Goals (Current goals can be found in the Care Plan section)  Acute Rehab PT Goals Patient Stated Goal: to go home soon PT Goal Formulation: With patient/family Time For Goal Achievement: 08/27/20 Potential to Achieve Goals: Good    Frequency     Barriers to discharge        Co-evaluation               AM-PAC PT "6 Clicks" Mobility  Outcome Measure Help needed turning from your back to your side while in a flat bed without using bedrails?: None Help needed moving from lying on your back to sitting  on the side of a flat bed without using bedrails?: None Help needed moving to and from a bed to a chair (including a wheelchair)?: None Help needed standing up from a chair using your arms (e.g., wheelchair or bedside chair)?: None Help needed to walk in hospital room?: A Little Help needed climbing 3-5 steps with a railing? : A Little 6 Click Score: 22    End of Session   Activity Tolerance: Patient tolerated treatment well Patient left: in chair;with call bell/phone within reach;with family/visitor present Nurse Communication: Mobility status;Other (comment) (pt able to mobilize independently but informed to call for nurse due to lines) PT Visit Diagnosis: Other symptoms and signs involving the nervous system (R29.898)    Time: 1349-1401 PT Time Calculation (min) (ACUTE ONLY): 12 min   Charges:   PT Evaluation $PT Eval Low Complexity: 1 Low          Raymond Gurney, PT, DPT Acute Rehabilitation Services  Pager: [phone] Office: [phone]   Jewel Baize 08/26/2020, 3:47 PM

## 2020-08-26 NOTE — TOC CAGE-AID Note (Signed)
Transition of Care [facility]) - CAGE-AID Screening   Patient Details  Name: Craig [name] MRN: [id number] Date of Birth: 10-06-1980  Transition of Care [facility]) CM/SW Contact:    Tate Zagal C Tarpley-Carter, LCSWA Phone Number: 08/26/2020, 11:37 AM   Clinical Narrative: Pt is unable to participate in Cage Aid.   Ason Heslin Tarpley-Carter, MSW, LCSW-A Pronouns:  She/Her/Hers                          [place] Clinical Social WorkerTransitions of Care Cell:  [phone] [email or web address]    CAGE-AID Screening: Substance Abuse Screening unable to be completed due to: : Patient unable to participate

## 2020-08-26 NOTE — Evaluation (Signed)
Occupational Therapy Evaluation Patient Details Name: Craig [name] MRN: [id number] DOB: 1980/07/12 Today's Date: 08/26/2020    History of Present Illness Acute onset of RUE weakness on a 4 day history of right sided headache and fluctuating vision loss. Received tPA 7/12. CT head: diminished flow intracranially ? stenosis at dural margin, small 4 mm area of calculated territory at risk on perfusion imaging within the inferior left cerebellum.PMHx: poorly controlled HTN   Clinical Impression   Pt PTA: Pt living alone, independent. Pt currently, at his functional baseline for OOB ADL and mobility. Vision, WNLs. No focal deficits noted on R side at this time. Pt's only complaint is a light headache. S/S of CVA reviewed with patient. Pt does not require continued OT skilled services. OT signing off.     Follow Up Recommendations  No OT follow up    Equipment Recommendations  None recommended by OT    Recommendations for Other Services       Precautions / Restrictions Precautions Precautions: None Restrictions Weight Bearing Restrictions: No      Mobility Bed Mobility Overal bed mobility: Independent                  Transfers Overall transfer level: Modified independent                    Balance Overall balance assessment: Modified Independent                                         ADL either performed or assessed with clinical judgement   ADL Overall ADL's : Modified independent;At baseline                                       General ADL Comments: Pt's only deficit is the slight headache even in dark room; pt advised to open curtains and let natural light in. Pt reported no increase in headache with natural lighting nor bathroom/main room lights on. Pt standing for OOB ADL tasks withno physical assist.     Vision Baseline Vision/History: No visual deficits Patient Visual Report: No change from baseline Vision  Assessment?: No apparent visual deficits     Perception     Praxis      Pertinent Vitals/Pain Pain Assessment: 0-10 Pain Score: 2  Pain Location: Headache Pain Descriptors / Indicators: Headache Pain Intervention(s): Monitored during session     Hand Dominance Right   Extremity/Trunk Assessment Upper Extremity Assessment Upper Extremity Assessment: Overall WFL for tasks assessed;RUE deficits/detail;LUE deficits/detail RUE Deficits / Details: strength 5/5 RUE Sensation: WNL RUE Coordination: WNL LUE Deficits / Details: strength 5/5 LUE Sensation: WNL LUE Coordination: WNL       Cervical / Trunk Assessment Cervical / Trunk Assessment: Normal;Other exceptions Cervical / Trunk Exceptions: large body habitus   Communication Communication Communication: No difficulties   Cognition Arousal/Alertness: Awake/alert Behavior During Therapy: WFL for tasks assessed/performed Overall Cognitive Status: Within Functional Limits for tasks assessed                                 General Comments: Pt reading for accuracy of visual scanning and pt skipping over large words and stating a similar word; pt reports "I can't read that good."  Pt reading from his phone in diffierent fonts/sizes without difficulty. Pt stating all 3/3 ADL items and able to use fo intended purpose.   General Comments  pt's family in room.    Exercises     Shoulder Instructions      Home Living Family/patient expects to be discharged to:: Private residence Living Arrangements: Alone Available Help at Discharge: Family;Friend(s);Available PRN/intermittently Type of Home: Mobile home Home Access: Stairs to enter Entrance Stairs-Number of Steps: 4 Entrance Stairs-Rails: Can reach both Home Layout: One level     Bathroom Shower/Tub: Chief Strategy Officer: Standard     Home Equipment: Grab bars - tub/shower   Additional Comments: mother lives 3 houses down  Lives With:  Alone    Prior Functioning/Environment Level of Independence: Independent                 OT Problem List: Decreased activity tolerance      OT Treatment/Interventions:      OT Goals(Current goals can be found in the care plan section) Acute Rehab OT Goals Patient Stated Goal: to go home soon Potential to Achieve Goals: Good  OT Frequency:     Barriers to D/C:            Co-evaluation              AM-PAC OT "6 Clicks" Daily Activity     Outcome Measure Help from another person eating meals?: None Help from another person taking care of personal grooming?: None Help from another person toileting, which includes using toliet, bedpan, or urinal?: None Help from another person bathing (including washing, rinsing, drying)?: None Help from another person to put on and taking off regular upper body clothing?: None Help from another person to put on and taking off regular lower body clothing?: None 6 Click Score: 24   End of Session Nurse Communication: Mobility status  Activity Tolerance: Patient tolerated treatment well Patient left: Other (comment) (up with PT in room)  OT Visit Diagnosis: Unsteadiness on feet (R26.81)                Time: 1335-1350 OT Time Calculation (min): 15 min Charges:  OT General Charges $OT Visit: 1 Visit OT Evaluation $OT Eval Moderate Complexity: 1 Mod  Flora Lipps, OTR/L Acute Rehabilitation Services Pager: [phone] Office: [phone]   Javien Tesch C 08/26/2020, 2:40 PM

## 2020-08-26 NOTE — Evaluation (Signed)
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 morbidly obese male with a PMHx of HTN (poorly compliant with medication), anxiety, chronic back pain and gout who presents via EMS as a Code Stroke. LKN 1800. He was driving and started losing strength in his right arm, being unable to move the gear shifter due to the weakness. Also was not feeling well and started feeling disoriented. Drove home and called 911. He did not notice any facial weakness or weakness of his RLE when ambulating. Denies dizziness.  Of note, for the past four days he has had headache and blurred in vision in right eye; the blurry vision waxes and wanes. He also experienced for the first time a sensation of dimming of his right eye vision as though a shade was coming down, when his right arm symptoms started. He states that his headache is in the right periorbital and temporal region, is nonthrobbing and currently rates the pain as 9/10. Some nausea is present as well. He has no history of migraine headache. CT head on 08/25/20 negative; MRI pending; Passed [facility] swallow screen; speech/language evaluation initiated.  Assessment / Plan / Recommendation Clinical Impression  Pt assessed via the SLUMS ([employer] Mental Status Examination) with a score of 26/30 obtained with pt exhibiting a 9/10 pain level d/t headache which could have impacted this assessment with 27/30 being a  typical score indicating no cognitive impairment.  Pt with difficulty only with task of repeating digits backwards (3+ digit numbers).  He was able to recall objects after a time delay, was oriented x4, speech was intelligible within simple conversation, word fluency/organization task [place] and simple calculation and other attention tasks/graphic expression/visuo-spatial skills were adequate for this assessment.  OME revealed normal oral/motor function.  Pt appears to be functioning at a normal cognitive level for education and pain reported interfering with some tasks on this assessment.   ST will s/o at this time. Thank you for this consult.    SLP Assessment  SLP Recommendation/Assessment: Patient does not need any further Speech Language Pathology Services SLP Visit Diagnosis: Cognitive communication deficit (R41.841)    Follow Up Recommendations  None    Frequency and Duration  (Evaluation only)         SLP Evaluation Cognition  Overall Cognitive Status: Within Functional Limits for tasks assessed Arousal/Alertness: Awake/alert Orientation Level: Oriented X4 (Simultaneous filing. User may not have seen previous data.) Attention: Sustained Sustained Attention: Appears intact Memory: Appears intact Immediate Memory Recall: Sock;Blue;Bed Memory Recall Sock: Without Cue Memory Recall Blue: Without Cue Memory Recall Bed: Without Cue Awareness: Appears intact Problem Solving: Appears intact Safety/Judgment: Appears intact       Comprehension  Auditory Comprehension Overall Auditory Comprehension: Appears within functional limits for tasks assessed Yes/No Questions: Within Functional Limits Commands: Within Functional Limits Conversation: Simple Interfering Components: Pain Visual Recognition/Discrimination Discrimination: Within Function Limits Reading Comprehension Reading Status:  Within funtional limits    Expression Expression Primary Mode of Expression: Verbal Verbal  Expression Overall Verbal Expression: Appears within functional limits for tasks assessed Initiation: No impairment Level of Generative/Spontaneous Verbalization: Conversation Repetition: No impairment Naming: No impairment Pragmatics: No impairment Non-Verbal Means of Communication: Not applicable Written Expression Dominant Hand: Right Written Expression: Within Functional Limits   Oral / Motor  Oral Motor/Sensory Function Overall Oral Motor/Sensory Function: Within functional limits Motor Speech Overall Motor Speech: Appears within functional limits for tasks assessed Respiration: Within functional limits Phonation: Normal Resonance: Within functional limits Articulation: Within functional limitis Intelligibility: Intelligible Motor Planning: Witnin functional limits Motor Speech Errors: Not applicable                       Tressie Stalker, M.S., CCC-SLP 08/26/2020, 11:48 AM

## 2020-08-26 NOTE — Progress Notes (Signed)
Per Dr. Roda Shutters patient will remain on bed rest but will have [facility] privileges with a stand and pivot.  Aris Lot, RN

## 2020-08-26 NOTE — Progress Notes (Addendum)
STROKE TEAM PROGRESS NOTE   INTERVAL HISTORY His Mom is at the bedside. Pt gives further information about having a h/a and tooth ache on right side of face, head and feels like this is now in his right ear. He had a tooth extraction done on Monday 7/11.  Vitals:   08/26/20 0630 08/26/20 0700 08/26/20 0800 08/26/20 0900  BP: 139/83 136/86 130/70 136/78  Pulse: 76 75 76 79  Resp: 17 20 (!) 21 (!) 24  Temp:   98.2 F (36.8 C)   TempSrc:   Oral   SpO2: 97% 95% 96% 96%  Weight:       CBC:  Recent Labs  Lab 08/25/20 1936 08/25/20 1955  WBC 12.4*  --   NEUTROABS 8.2*  --   HGB 15.6 16.3  HCT 48.1 48.0  MCV 87.9  --   PLT 395  --    Basic Metabolic Panel:  Recent Labs  Lab 08/25/20 1936 08/25/20 1955  NA 140 140  K 4.6 4.4  CL 103 104  CO2 26  --   GLUCOSE 90 88  BUN 14 15  CREATININE 1.00 1.00  CALCIUM 9.8  --    Lipid Panel:  Recent Labs  Lab 08/26/20 0016  CHOL 170  TRIG 129  HDL 33*  CHOLHDL 5.2  VLDL 26  LDLCALC 111*   HgbA1c: No results for input(s): HGBA1C in the last 168 hours. Urine Drug Screen:  Recent Labs  Lab 08/25/20 1936  LABOPIA POSITIVE*  COCAINSCRNUR NONE DETECTED  LABBENZ NONE DETECTED  AMPHETMU NONE DETECTED  THCU NONE DETECTED  LABBARB NONE DETECTED    Alcohol Level  Recent Labs  Lab 08/25/20 1936  ETH <10    IMAGING past 24 hours CT CEREBRAL PERFUSION W CONTRAST  Result Date: 08/25/2020 CLINICAL DATA:  Right arm weakness EXAM: CT ANGIOGRAPHY HEAD AND NECK CT PERFUSION BRAIN TECHNIQUE: Multidetector CT imaging of the head and neck was performed using the standard protocol during bolus administration of intravenous contrast. Multiplanar CT image reconstructions and MIPs were obtained to evaluate the vascular anatomy. Carotid stenosis measurements (when applicable) are obtained utilizing NASCET criteria, using the distal internal carotid diameter as the denominator. Multiphase CT imaging of the brain was performed following IV  bolus contrast injection. Subsequent parametric perfusion maps were calculated using RAPID software. CONTRAST:  120m OMNIPAQUE IOHEXOL 350 MG/ML SOLN COMPARISON:  None. FINDINGS: CTA NECK Aortic arch: Great vessel origins are patent. Right carotid system: Patent.  No stenosis. Left carotid system: Patent. No stenosis. Partially retropharyngeal course. Vertebral arteries: Dominant right vertebral artery is patent. 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 Skeleton: No significant abnormality. Other neck: Unremarkable. Upper chest: Unremarkable. Review of the MIP images confirms the above findings CTA HEAD Anterior circulation: Intracranial internal carotid arteries are patent. Anterior and middle cerebral arteries are patent. Posterior circulation: Intracranial right vertebral artery is patent. There is diminished opacification of the intracranial left vertebral artery, which could terminate as a PICA. May reflect stenosis at the V3-V4 junction. Basilar artery is patent. Major cerebellar artery origins are patent. Posterior cerebral arteries are patent. Venous sinuses: Patent as allowed by contrast bolus timing. Review of the MIP images confirms the above findings CT Brain Perfusion Findings: CBF (<30%) Volume: 093mPerfusion (Tmax>6.0s) volume: 59m359mismatch Volume: 59mL32mfarction Location: Area of calculated territory at risk is within the inferior left cerebellum. IMPRESSION: No occlusion or hemodynamically significant stenosis in the neck. No evidence of core infarction on perfusion imaging.  Small caliber non dominant left vertebral artery. There is diminished flow intracranially possibly secondary to stenosis at dural margin. A small 4 mm area of calculated territory at risk on perfusion imaging within the inferior left cerebellum may be related to this. Initial results were provided by telephone at the time of interpretation on 08/25/2020 at 8:40 pm to provider ERIC [facility] , who verbally  acknowledged these results. Electronically Signed   By: Macy Mis M.D.   On: 08/25/2020 20:44   CT HEAD CODE STROKE WO CONTRAST  Result Date: 08/25/2020 CLINICAL DATA:  Code stroke.  Right arm weakness EXAM: CT HEAD WITHOUT CONTRAST TECHNIQUE: Contiguous axial images were obtained from the base of the skull through the vertex without intravenous contrast. COMPARISON:  None. FINDINGS: Brain: There is no acute intracranial hemorrhage, mass effect, or edema. Gray-white differentiation is preserved. Ventricles and sulci are size configuration there is no extra-axial collection. Vascular: No hyperdense vessel. Skull: Unremarkable. Sinuses/Orbits: No acute abnormality. Other: Mastoid air cells are clear. ASPECTS ([place] Stroke Program Early CT Score) - Ganglionic level infarction (caudate, lentiform nuclei, internal capsule, insula, M1-M3 cortex): 7 - Supraganglionic infarction (M4-M6 cortex): 3 Total score (0-10 with 10 being normal): 10 IMPRESSION: There is no acute intracranial hemorrhage or evidence of acute infarction. ASPECT score is 10. Initial results were called by telephone at the time of interpretation on 08/25/2020 at 7:56 pm to provider Dr. Cheral Marker, who verbally acknowledged these results. Electronically Signed   By: Macy Mis M.D.   On: 08/25/2020 19:58   CT ANGIO HEAD CODE STROKE  Result Date: 08/25/2020 CLINICAL DATA:  Right arm weakness EXAM: CT ANGIOGRAPHY HEAD AND NECK CT PERFUSION BRAIN TECHNIQUE: Multidetector CT imaging of the head and neck was performed using the standard protocol during bolus administration of intravenous contrast. Multiplanar CT image reconstructions and MIPs were obtained to evaluate the vascular anatomy. Carotid stenosis measurements (when applicable) are obtained utilizing NASCET criteria, using the distal internal carotid diameter as the denominator. Multiphase CT imaging of the brain was performed following IV bolus contrast injection. Subsequent parametric  perfusion maps were calculated using RAPID software. CONTRAST:  186mL OMNIPAQUE IOHEXOL 350 MG/ML SOLN COMPARISON:  None. FINDINGS: CTA NECK Aortic arch: Great vessel origins are patent. Right carotid system: Patent.  No stenosis. Left carotid system: Patent. No stenosis. Partially retropharyngeal course. Vertebral arteries: Dominant right vertebral artery is patent. 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 Skeleton: No significant abnormality. Other neck: Unremarkable. Upper chest: Unremarkable. Review of the MIP images confirms the above findings CTA HEAD Anterior circulation: Intracranial internal carotid arteries are patent. Anterior and middle cerebral arteries are patent. Posterior circulation: Intracranial right vertebral artery is patent. There is diminished opacification of the intracranial left vertebral artery, which could terminate as a PICA. May reflect stenosis at the V3-V4 junction. Basilar artery is patent. Major cerebellar artery origins are patent. Posterior cerebral arteries are patent. Venous sinuses: Patent as allowed by contrast bolus timing. Review of the MIP images confirms the above findings CT Brain Perfusion Findings: CBF (<30%) Volume: 22mL Perfusion (Tmax>6.0s) volume: 71mL Mismatch Volume: 95mL Infarction Location: Area of calculated territory at risk is within the inferior left cerebellum. IMPRESSION: No occlusion or hemodynamically significant stenosis in the neck. No evidence of core infarction on perfusion imaging. Small caliber non dominant left vertebral artery. There is diminished flow intracranially possibly secondary to stenosis at dural margin. A small 4 mm area of calculated territory at risk on perfusion imaging within the inferior left  cerebellum may be related to this. Initial results were provided by telephone at the time of interpretation on 08/25/2020 at 8:40 pm to provider ERIC [facility] , who verbally acknowledged these results. Electronically Signed   By:  Macy Mis M.D.   On: 08/25/2020 20:44   CT ANGIO NECK CODE STROKE  Result Date: 08/25/2020 CLINICAL DATA:  Right arm weakness EXAM: CT ANGIOGRAPHY HEAD AND NECK CT PERFUSION BRAIN TECHNIQUE: Multidetector CT imaging of the head and neck was performed using the standard protocol during bolus administration of intravenous contrast. Multiplanar CT image reconstructions and MIPs were obtained to evaluate the vascular anatomy. Carotid stenosis measurements (when applicable) are obtained utilizing NASCET criteria, using the distal internal carotid diameter as the denominator. Multiphase CT imaging of the brain was performed following IV bolus contrast injection. Subsequent parametric perfusion maps were calculated using RAPID software. CONTRAST:  145m OMNIPAQUE IOHEXOL 350 MG/ML SOLN COMPARISON:  None. FINDINGS: CTA NECK Aortic arch: Great vessel origins are patent. Right carotid system: Patent.  No stenosis. Left carotid system: Patent. No stenosis. Partially retropharyngeal course. Vertebral arteries: Dominant right vertebral artery is patent. 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 Skeleton: No significant abnormality. Other neck: Unremarkable. Upper chest: Unremarkable. Review of the MIP images confirms the above findings CTA HEAD Anterior circulation: Intracranial internal carotid arteries are patent. Anterior and middle cerebral arteries are patent. Posterior circulation: Intracranial right vertebral artery is patent. There is diminished opacification of the intracranial left vertebral artery, which could terminate as a PICA. May reflect stenosis at the V3-V4 junction. Basilar artery is patent. Major cerebellar artery origins are patent. Posterior cerebral arteries are patent. Venous sinuses: Patent as allowed by contrast bolus timing. Review of the MIP images confirms the above findings CT Brain Perfusion Findings: CBF (<30%) Volume: 060mPerfusion (Tmax>6.0s) volume: 10m71mismatch Volume:  10mL710mfarction Location: Area of calculated territory at risk is within the inferior left cerebellum. IMPRESSION: No occlusion or hemodynamically significant stenosis in the neck. No evidence of core infarction on perfusion imaging. Small caliber non dominant left vertebral artery. There is diminished flow intracranially possibly secondary to stenosis at dural margin. A small 4 mm area of calculated territory at risk on perfusion imaging within the inferior left cerebellum may be related to this. Initial results were provided by telephone at the time of interpretation on 08/25/2020 at 8:40 pm to provider [facility] verbally acknowledged these results. Electronically Signed   By: PranMacy Mis.   On: 08/25/2020 20:44    PHYSICAL EXAM General: Appears well-developed; super morbidly obese. Psych: Anxious Eyes: No scleral injection HENT: Mouth pink, moist, upon oral cavity inspection, there is some blood/swelling noted in the right upper , back from molar extraction done on Monday. Report tightness, swelling. Head: Normocephalic.  Cardiovascular: Normal rate and regular rhythm.  Respiratory: Effort normal and breath sounds normal to anterior ascultation GI: Soft.  No distension. There is no tenderness.  Skin: WDI    Neurological Examination Mental Status: Alert, oriented, thought content appropriate.  Speech fluent without evidence of aphasia. Able to follow 3 step commands without difficulty. Cranial Nerves: II: Visual fields grossly normal,  III,IV, VI: ptosis not present, extra-ocular motions intact bilaterally, pupils equal, round, reactive to light and accommodation V,VII: smile symmetric, facial light touch sensation normal bilaterally VIII: hearing normal bilaterally IX,X: uvula rises symmetrically XI: bilateral shoulder shrug XII: midline tongue extension Motor: Right : Upper extremity   5/5    Left:  Upper extremity   5/5  Lower extremity   5/5     Lower extremity    5/5 Tone and bulk:normal tone throughout; no atrophy noted Sensory: Pinprick and light touch intact throughout, bilaterally Deep Tendon Reflexes: 2+ and symmetric throughout Plantars: Right: downgoing   Left: downgoing Cerebellar: normal finger-to-nose, normal rapid alternating movements and normal heel-to-shin test Gait: normal gait and station   ASSESSMENT/PLAN Mr. CYRUS RAMSBURG is a 40 y.o. male with history HTN, medication non-compliance, supermorbidly obese, presenting with acute onset of right sided weakness.He was treated with IV tPA, no LVO for thrombectomy. Weakness has now resolved completed back to baseline except for continuing right side h/a , right jaw/tooth socket pain and right "ear" pain 9/10. No nuchal rigidity or fever.   MRI w/wo pending for better etiology. Current ddx is also complex or hemiplegic migraine. Abscess /infection is possible given timeline with h/a and tooth pain leading to extraction on Monday. Code Stroke CT head No acute abnormality. ASPECTS 10.    CTA head & neck no occlusion or hemodynamic significant stenosis in head/neck,  CT perfusion no core seen. Also reviewed with Radiology, Dr Nevada Crane to see if there was any jaw/ear concerns given c/o's. MRI w/wo: pending 2D Echo 60-65% EF,  LDL 111 HgbA1c No results found for requested labs within last 26280 hours. VTE prophylaxis - SCDs post tPA none  prior to admission, now on  post tPA . Once f/u imaging is done, plan to start DAPT. Therapy recommendations:  pending Disposition:  pending  Hypertension Home meds:  Zestoretic Stable Permissive hypertension post tPA (OK if < 180/105) but gradually normalize in 5-7 days Long-term BP goal normotensive  Hyperlipidemia Home meds:  none LDL 111, goal < 70 Add Lipitor 58m daily  High intensity statin  Continue statin at discharge  Other Stroke Risk Factors ETOH use, alcohol level <10, advised to drink no more than 1 drink a day Obesity, Body mass  index is 59.58 kg/m., BMI >/= 30 associated with increased stroke risk, recommend weight loss, diet and exercise as appropriate  Appears to have Obstructive sleep apnea, Not on CPAP at home. Needs formal out pt sleep study  Other Active Problems Depression/Anxiety- continue home Lexapro and Xanex BID GERD- will add Pepcid BID as he takes PPI at home  Hospital day # 1  Desiree Metzger-Cihelka, ARNP-C, ANVP-BC Pager: [phone]  ATTENDING NOTE: I reviewed above note and agree with the assessment and plan. Pt was seen and examined.   [age]year old male with history of hypertension, lower back pain, gout, morbid obesity admitted for right-sided weakness and right eye blurry vision.  Patient stated that he had bad headache more on the right side of the head for the last 1 months, associated with right upper tooth ache.  Family he got right upper tooth extraction 3 days ago, however continued to have headache and he also felt right ear pain.  Yesterday he had right arm weakness and also right eye blurry vision.  CT no acute abnormality.  Status post tPA.  CTA head and neck no LVO, left VA stenosis.  CTP 4cc perfusion deficit, likely artifact.  EF 60 to 65%.  MRI with and without contrast pending.  LDL 111, A1c 5.4.  UDS positive for opioids.  Creatinine 1.0, WBC 12.4.  Current neurological examination intact, no focal deficit.  Still complaining of right side headache, come and goes and feeling right ear pain.  Etiology for patient episode of right-sided weakness and  headache and right eye blurry vision not quite clear.  Could be small stroke versus complicated migraine versus hemiplegic migraine.  If stroke on MRI, patient need full embolic stroke work-up including TEE, hypercoagulable work-up.  Etiology for patient headache not quite clear, patient no venous thrombosis on CT head and neck, no nuchal rigidity to consider meningitis, patient does have right sinusitis but no mastoiditis.  No family  history of hemiplegic migraine.  Patient mom does have migraine history.  Unlikely temporal arteritis, but will check ESR CRP.  Another possibility is pseudotumor cerebri, if MRI negative, may consider LP to check opening pressure under fluoroscopy.  Will consider Topamax for headache if needed after above work-up.  For detailed assessment and plan, please refer to above as I have made changes wherever appropriate.   Rosalin Hawking, MD PhD Stroke Neurology 08/26/2020 7:35 PM  This patient is critically ill due to stroke symptoms status post tPA, headache and at significant risk of neurological worsening, death form bleeding from tPA, recurrent stroke, hemorrhagic conversion. This patient's care requires constant monitoring of vital signs, hemodynamics, respiratory and cardiac monitoring, review of multiple databases, neurological assessment, discussion with family, other specialists and medical decision making of high complexity. I spent 40 minutes of neurocritical care time in the care of this patient. I had long discussion with patient and mom at bedside, updated pt current condition, treatment plan and potential prognosis, and answered all the questions.  They expressed understanding and appreciation.      To contact Stroke Continuity provider, please refer to [email or web address] After hours, contact General Neurology

## 2020-08-26 NOTE — Progress Notes (Signed)
OT Cancellation Note  Patient Details Name: Craig [name] MRN: [id number] DOB: [birth year]   Cancelled Treatment:    Reason Eval/Treat Not Completed: Active bedrest order  Flora Lipps, OTR/L Acute Rehabilitation Services Pager: [phone] Office: [phone]  [name] C 08/26/2020, 11:12 AM

## 2020-08-27 DIAGNOSIS — G43519 Persistent migraine aura without cerebral infarction, intractable, without status migrainosus: Secondary | ICD-10-CM

## 2020-08-27 DIAGNOSIS — F330 Major depressive disorder, recurrent, mild: Secondary | ICD-10-CM

## 2020-08-27 DIAGNOSIS — G43411 Hemiplegic migraine, intractable, with status migrainosus: Secondary | ICD-10-CM

## 2020-08-27 DIAGNOSIS — F411 Generalized anxiety disorder: Secondary | ICD-10-CM

## 2020-08-27 DIAGNOSIS — G459 Transient cerebral ischemic attack, unspecified: Secondary | ICD-10-CM

## 2020-08-27 DIAGNOSIS — R519 Headache, unspecified: Secondary | ICD-10-CM

## 2020-08-27 DIAGNOSIS — G8929 Other chronic pain: Secondary | ICD-10-CM

## 2020-08-27 LAB — CBC
HCT: 43 % (ref 39.0–52.0)
Hemoglobin: 14.1 g/dL (ref 13.0–17.0)
MCH: 28.6 pg (ref 26.0–34.0)
MCHC: 32.8 g/dL (ref 30.0–36.0)
MCV: 87.2 fL (ref 80.0–100.0)
Platelets: 326 10*3/uL (ref 150–400)
RBC: 4.93 MIL/uL (ref 4.22–5.81)
RDW: 13.3 % (ref 11.5–15.5)
WBC: 11.9 10*3/uL — ABNORMAL HIGH (ref 4.0–10.5)
nRBC: 0 % (ref 0.0–0.2)

## 2020-08-27 LAB — BASIC METABOLIC PANEL
Anion gap: 7 (ref 5–15)
BUN: 12 mg/dL (ref 6–20)
CO2: 27 mmol/L (ref 22–32)
Calcium: 9 mg/dL (ref 8.9–10.3)
Chloride: 101 mmol/L (ref 98–111)
Creatinine, Ser: 0.88 mg/dL (ref 0.61–1.24)
GFR, Estimated: >60 mL/min (ref >60)
Glucose, Bld: 97 mg/dL (ref 70–99)
Potassium: 4 mmol/L (ref 3.5–5.1)
Sodium: 135 mmol/L (ref 135–145)

## 2020-08-27 LAB — C-REACTIVE PROTEIN: CRP: 2.7 mg/dL — ABNORMAL HIGH (ref <1.0)

## 2020-08-27 LAB — SEDIMENTATION RATE: Sed Rate: 9 mm/hr (ref 0–16)

## 2020-08-27 MED ORDER — TOPIRAMATE 25 MG PO TABS: 25.0000 mg | ORAL_TABLET | Freq: Every day | ORAL | Status: DC

## 2020-08-27 MED ORDER — TOPIRAMATE 25 MG PO TABS: 25.0000 mg | ORAL_TABLET | Freq: Every day | ORAL | 0 refills | Status: AC

## 2020-08-27 MED ORDER — ESCITALOPRAM OXALATE 20 MG PO TABS: 20.0000 mg | ORAL_TABLET | Freq: Every day | ORAL | 0 refills | Status: AC

## 2020-08-27 MED ORDER — ALPRAZOLAM 0.5 MG PO TABS: 0.5000 mg | ORAL_TABLET | Freq: Two times a day (BID) | ORAL | 0 refills | Status: AC

## 2020-08-27 MED ORDER — ASCRIPTIN 325 MG PO TABS: 1.0000 | ORAL_TABLET | Freq: Every day | ORAL | 2 refills | Status: AC

## 2020-08-27 MED ORDER — FAMOTIDINE 20 MG PO TABS: 20.0000 mg | ORAL_TABLET | Freq: Two times a day (BID) | ORAL | Status: AC

## 2020-08-27 MED ORDER — ASPIRIN 325 MG PO TABS: 325.0000 mg | ORAL_TABLET | Freq: Every day | ORAL | Status: DC

## 2020-08-27 MED ORDER — ATORVASTATIN CALCIUM 40 MG PO TABS: 40.0000 mg | ORAL_TABLET | Freq: Every day | ORAL | 0 refills | Status: AC

## 2020-08-27 NOTE — Discharge Summary (Addendum)
Physician Discharge Summary   ATTENDING NOTE: I reviewed above note and agree with the assessment and plan. Pt was seen and examined.   No acute event overnight, patient neuro intact no focal deficit.  MRI without contrast no acute infarct, normal brain.  Not able to tolerate the contrast part of the MRI.  However, CTA head and neck did not show any inflammation/infection except right maxillary sinus sinusitis.  Etiology for patient episode of right-sided weakness not quite clear, concerning for TIA versus complicated migraine versus hemiplegic migraine.  Will start aspirin and continue statin on discharge for stroke prevention.  Patient still complaining of right side headache, although better than yesterday but not resolved.  Etiology for the headache not quite clear, however, no cerebral venous thrombosis on CTA head and neck, ESR only 9, CRP 2.7 likely due to recent tooth extraction and sinusitis, not consistent with temporal arteritis given young age. No RCVS or PRES on CTA or MRI.  No meningitis given no fever, meningismus.  Migraine or pseudotumor cerebri are in the differential.  However, patient reluctant to have LP for opening pressure, I recommended to start Topamax and follow-up with Dr. Jaynee Eagles at [facility].  He is in agreement.  Otherwise, he is ready for discharge.  For detailed assessment and plan, please refer to above as I have made changes wherever appropriate.   Rosalin Hawking, MD PhD Stroke Neurology 08/27/2020 8:30 PM    Patient ID: Craig [name] MRN: [id number] DOB/AGE: 1980-08-20 40 y.o.  Admit date: 08/25/2020 Discharge date: 08/27/2020  Admission Diagnoses: Acute stroke  Discharge Diagnoses:  TIA vs Aborted stroke vs Complex Migraine/hemiplegic migraine Supermorbid obesity Depression/anxiety Tooth-ache  Discharged Condition: good  Hospital Course: Mr. Craig [name] is a 40 y.o. male with history HTN, medication non-compliance, supermorbidly obese, presenting with  acute onset of right sided weakness.He was treated with IV tPA, no LVO for thrombectomy. Weakness has now resolved completed back to baseline except for continuing right side h/a , right jaw/tooth socket pain and right "ear" pain 9/10. No nuchal rigidity or fever.    MRI showed no abnormality. Current ddx TIA vs aborted stroke vs complex or hemiplegic migraine. While no abscess /infection seen on imaging or labs, it is possible given timeline with h/a and tooth pain leading to extraction on Monday. Code Stroke CT head No acute abnormality. ASPECTS 10.    CTA head & neck no occlusion or hemodynamic significant stenosis in head/neck,  CT perfusion no core seen. Also reviewed with Radiology, Dr Nevada Crane to see if there was any jaw/ear concerns given c/o's. MRI wo: NEG 2D Echo 60-65% EF,  LDL 111 HgbA1c No results found for requested labs within last 26280 hours. VTE prophylaxis - SCDs post tPA, now up able to ambulate normally none  prior to admission, now will start on ASA 377m daily for Primary Stroke Prevention. Therapy recommendations:  none- they s/o Disposition:  Home, no rehab needs   Hypertension Home meds:  Zestoretic Stable Permissive hypertension post tPA (OK if < 180/105) but gradually normalize in 5-7 days Long-term BP goal normotensive   Hyperlipidemia Home meds:  none LDL 111, goal < 70 Add Lipitor 43mdaily  High intensity statin Continue statin at discharge given risk factors and possible TIA   Other Stroke Risk Factors ETOH use, alcohol level <10, advised to drink no more than 1 drink a day Obesity, Body mass index is 59.58 kg/m., BMI >/= 30 associated with increased stroke risk, recommend weight loss, diet  and exercise as appropriate  Appears to have Obstructive sleep apnea, Not on CPAP at home. Needs formal out pt sleep study   Other Active Problems Depression/Anxiety- continue home Lexapro daily and Xanex BID GERD- continue PPI as at home Migraine headache- Will  start Topamax at 41m daily with titration up to max of 2069mday and recommend out pt f/u.   Consults: None  Significant Diagnostic Studies: labs, microbiology, radiology: CXR: normal, MRI: normal, and CT scan: normal, some inflammation near tooth abstract, but no abscess , cardiac graphics: Telemetry:   and ECG: NSR , and angiography  Treatments: IV hydration and IV Alteplase  Discharge Exam: Blood pressure 133/85, pulse 86, temperature 98.3 F (36.8 C), temperature source Axillary, resp. rate (!) 21, weight (!) 183 kg, SpO2 95 %. General: Appears well-developed; super morbidly obese. Psych: Anxious Eyes: No scleral injection HENT: Mouth pink, moist, upon oral cavity inspection, there is some blood/swelling noted in the right upper , back from molar extraction done on Monday. Report tightness, swelling. Head: Normocephalic. Cardiovascular: Normal rate and regular rhythm. Respiratory: Effort normal and breath sounds normal to anterior ascultation GI: Soft.  No distension. There is no tenderness. Skin: WDI     Neurological Examination Mental Status: Alert, oriented, thought content appropriate.  Speech fluent without evidence of aphasia. Able to follow 3 step commands without difficulty. Cranial Nerves: II: Visual fields grossly normal, III,IV, VI: ptosis not present, extra-ocular motions intact bilaterally, pupils equal, round, reactive to light and accommodation V,VII: smile symmetric, facial light touch sensation normal bilaterally VIII: hearing normal bilaterally IX,X: uvula rises symmetrically XI: bilateral shoulder shrug XII: midline tongue extension Motor: Right : Upper extremity   5/5                                      Left:     Upper extremity   5/5             Lower extremity   5/5                                                  Lower extremity   5/5 Tone and bulk:normal tone throughout; no atrophy noted Sensory: Pinprick and light touch intact throughout,  bilaterally Deep Tendon Reflexes: 2+ and symmetric throughout Plantars: Right: downgoing                               Left: downgoing Cerebellar: normal finger-to-nose, normal rapid alternating movements and normal heel-to-shin test Gait: normal gait and station   Disposition: Discharge disposition: 01-Home or Self Care      Discharge Instructions     Ambulatory referral to Neurology   Complete by: As directed    [facility] New consult for HA. Thanks.      Allergies as of 08/27/2020   No Known Allergies      Medication List     TAKE these medications    ALPRAZolam 0.5 MG tablet Commonly known as: XANAX Take 1 tablet (0.5 mg total) by mouth 2 (two) times daily.   Ascriptin 325 MG Tabs Take 1 tablet by mouth daily.   atorvastatin 40 MG tablet Commonly  known as: LIPITOR Take 1 tablet (40 mg total) by mouth daily. Start taking on: August 28, 2020   escitalopram 20 MG tablet Commonly known as: LEXAPRO Take 1 tablet (20 mg total) by mouth daily. Start taking on: August 28, 2020   famotidine 20 MG tablet Commonly known as: PEPCID Take 1 tablet (20 mg total) by mouth 2 (two) times daily.   lisinopril-hydrochlorothiazide 20-12.5 MG tablet Commonly known as: ZESTORETIC Take 1 tablet by mouth every morning.   topiramate 25 MG tablet Commonly known as: TOPAMAX Take 1 tablet (25 mg total) by mouth at bedtime.        Follow-up Information     Melvenia Beam, MD. Schedule an appointment as soon as possible for a visit in 1 month(s).   Specialty: Neurology Contact information: [place] [address] [phone]                63mn time spent on dc today Signed: Desiree Metzger-Cihelka 08/27/2020, 12:09 PM

## 2020-08-27 NOTE — TOC Transition Note (Signed)
Transition of Care [facility]) - CM/SW Discharge Note   Patient Details  Name: Craig [name] MRN: [id number] Date of Birth: Oct 30, 1980  Transition of [facility]) CM/SW Contact:  Glennon Mac, RN Phone Number: 08/27/2020, 12:19 PM   Clinical Narrative:   Pt is a 40 y.o. male who presented 7/12 with acute onset of RUE weakness on a 4 day history of right sided headache and fluctuating vision loss. Received tPA 7/12. PTA, pt independent and living alone; he states that his mother lives 3 doors down from him, and can assist him as needed.  PT/OT recommending no OP follow up.  Pt has Medicaid; states he has no issues affording meds, which are usually $3-4.  Pt has PCP appointment scheduled at [facility] in [facility] for 09/07/20 at 3pm.  He denies any dc needs.     Final next level of care: Home/Self Care Barriers to Discharge: Barriers Resolved                       Discharge Plan and Services   Discharge Planning Services: CM Consult                                     Readmission Risk Interventions Readmission Risk Prevention Plan 08/27/2020  Post Dischage Appt Complete  Medication Screening Complete  Transportation Screening Complete  Some recent data might be hidden   Quintella Baton, RN, BSN  Trauma/Neuro ICU Case Manager [phone]

## 2020-10-05 ENCOUNTER — Encounter: Payer: Self-pay | Admitting: Neurology

## 2020-10-05 ENCOUNTER — Inpatient Hospital Stay: Payer: Medicaid Other | Admitting: Neurology

## 2020-12-04 ENCOUNTER — Other Ambulatory Visit: Payer: Self-pay

## 2020-12-04 NOTE — Patient Outreach (Signed)
[facility]) Care Management  12/04/2020  Craig [name] 06-17-1980 [id number]   First telephone outreach attempt to obtain mRS. Call Dropped.  Vanice Sarah [facility] Management Assistant [phone]

## 2020-12-10 ENCOUNTER — Other Ambulatory Visit: Payer: Self-pay

## 2020-12-10 NOTE — Patient Outreach (Signed)
[facility]) Care Management  12/10/2020  Craig [name] 1981/02/10 [id number]   Telephone outreach to patient to obtain mRS was successfully completed. MRS= 0   Vanice Sarah [facility] Care Management Assistant

## 2021-04-12 ENCOUNTER — Encounter (HOSPITAL_COMMUNITY): Payer: Self-pay

## 2021-04-12 ENCOUNTER — Emergency Department (HOSPITAL_COMMUNITY)
Admission: EM | Admit: 2021-04-12 | Discharge: 2021-04-13 | Discharge status: Against Medical Advice | Payer: Medicaid Other | Attending: Emergency Medicine | Admitting: Emergency Medicine

## 2021-04-12 ENCOUNTER — Emergency Department (HOSPITAL_COMMUNITY): Payer: Medicaid Other

## 2021-04-12 DIAGNOSIS — Z5321 Procedure and treatment not carried out due to patient leaving prior to being seen by health care provider: Secondary | ICD-10-CM | POA: Diagnosis not present

## 2021-04-12 DIAGNOSIS — R519 Headache, unspecified: Secondary | ICD-10-CM | POA: Diagnosis not present

## 2021-04-12 DIAGNOSIS — H538 Other visual disturbances: Secondary | ICD-10-CM | POA: Diagnosis not present

## 2021-04-12 HISTORY — DX: Cerebral infarction, unspecified: I63.9

## 2021-04-12 LAB — I-STAT CHEM 8, ED
BUN: 10 mg/dL (ref 6–20)
Calcium, Ion: 1.16 mmol/L (ref 1.15–1.40)
Chloride: 100 mmol/L (ref 98–111)
Creatinine, Ser: 1.3 mg/dL — ABNORMAL HIGH (ref 0.61–1.24)
Glucose, Bld: 94 mg/dL (ref 70–99)
HCT: 49 % (ref 39.0–52.0)
Hemoglobin: 16.7 g/dL (ref 13.0–17.0)
Potassium: 4.1 mmol/L (ref 3.5–5.1)
Sodium: 138 mmol/L (ref 135–145)
TCO2: 29 mmol/L (ref 22–32)

## 2021-04-12 LAB — DIFFERENTIAL
Abs Immature Granulocytes: 0.05 10*3/uL (ref 0.00–0.07)
Basophils Absolute: 0.1 10*3/uL (ref 0.0–0.1)
Basophils Relative: 1 %
Eosinophils Absolute: 0.2 10*3/uL (ref 0.0–0.5)
Eosinophils Relative: 1 %
Immature Granulocytes: 0 %
Lymphocytes Relative: 29 %
Lymphs Abs: 4 10*3/uL (ref 0.7–4.0)
Monocytes Absolute: 0.8 10*3/uL (ref 0.1–1.0)
Monocytes Relative: 6 %
Neutro Abs: 8.9 10*3/uL — ABNORMAL HIGH (ref 1.7–7.7)
Neutrophils Relative %: 63 %

## 2021-04-12 LAB — CBC
HCT: 48.1 % (ref 39.0–52.0)
Hemoglobin: 15.9 g/dL (ref 13.0–17.0)
MCH: 29.1 pg (ref 26.0–34.0)
MCHC: 33.1 g/dL (ref 30.0–36.0)
MCV: 88.1 fL (ref 80.0–100.0)
Platelets: 454 10*3/uL — ABNORMAL HIGH (ref 150–400)
RBC: 5.46 MIL/uL (ref 4.22–5.81)
RDW: 13 % (ref 11.5–15.5)
WBC: 14.1 10*3/uL — ABNORMAL HIGH (ref 4.0–10.5)
nRBC: 0 % (ref 0.0–0.2)

## 2021-04-12 LAB — COMPREHENSIVE METABOLIC PANEL
ALT: 21 U/L (ref 0–44)
AST: 19 U/L (ref 15–41)
Albumin: 3.8 g/dL (ref 3.5–5.0)
Alkaline Phosphatase: 73 U/L (ref 38–126)
Anion gap: 10 (ref 5–15)
BUN: 9 mg/dL (ref 6–20)
CO2: 26 mmol/L (ref 22–32)
Calcium: 9.1 mg/dL (ref 8.9–10.3)
Chloride: 101 mmol/L (ref 98–111)
Creatinine, Ser: 1.16 mg/dL (ref 0.61–1.24)
GFR, Estimated: >60 mL/min (ref >60)
Glucose, Bld: 94 mg/dL (ref 70–99)
Potassium: 4.2 mmol/L (ref 3.5–5.1)
Sodium: 137 mmol/L (ref 135–145)
Total Bilirubin: 0.3 mg/dL (ref 0.3–1.2)
Total Protein: 7.6 g/dL (ref 6.5–8.1)

## 2021-04-12 LAB — PROTIME-INR
INR: 1 (ref 0.8–1.2)
Prothrombin Time: 12.9 seconds (ref 11.4–15.2)

## 2021-04-12 LAB — APTT: aPTT: 25 seconds (ref 24–36)

## 2021-04-12 MED ORDER — SODIUM CHLORIDE 0.9% FLUSH: 3.0000 mL | Freq: Once | INTRAVENOUS | Status: DC

## 2021-04-12 NOTE — ED Triage Notes (Signed)
Pt comes via [facility] for headache for the past 4 days, blurred vision in the R eye as well, neuro intact bilaterally otherwise

## 2021-04-12 NOTE — ED Provider Triage Note (Signed)
Emergency Medicine Provider Triage Evaluation Note  Craig [name] , a 41 y.o. male  was evaluated in triage.  Pt complains of right-sided eye pain and headache.  Patient does have a history of stroke back in July which presented similar.  He denies any focal weakness or numbness at this point.  This been going on for 2 days.  Review of Systems  Positive:  Negative: See above   Physical Exam  BP (!) 142/88 (BP Location: Right Arm)    Pulse 90    Temp 99 F (37.2 C) (Oral)    Resp 20    SpO2 98%  Gen:   Awake, no distress   Resp:  Normal effort  MSK:   Moves extremities without difficulty  Other:  5/5 strength to the upper and lower extremities.  Normal sensation to the upper and lower extremities.  No pronator drift.  Cranial nerves II through XII are intact.  Pupils are equal round reactive to light.  No sustained nystagmus to extraocular movements.  Medical Decision Making  Medically screening exam initiated at 9:29 PM.  Appropriate orders placed.  Renie Ora was informed that the remainder of the evaluation will be completed by another provider, this initial triage assessment does not replace that evaluation, and the importance of remaining in the ED until their evaluation is complete.     Myna Bright [place], [place] 04/12/21 2131

## 2021-04-13 NOTE — ED Notes (Signed)
Patient states he is leaving d/t wait time 

## 2022-05-15 ENCOUNTER — Emergency Department (HOSPITAL_BASED_OUTPATIENT_CLINIC_OR_DEPARTMENT_OTHER): Payer: Medicaid Other

## 2022-05-15 ENCOUNTER — Emergency Department (HOSPITAL_BASED_OUTPATIENT_CLINIC_OR_DEPARTMENT_OTHER)
Admission: EM | Admit: 2022-05-15 | Discharge: 2022-05-15 | Disposition: A | Discharge status: Home / Self Care | Payer: Medicaid Other | Attending: Emergency Medicine | Admitting: Emergency Medicine

## 2022-05-15 ENCOUNTER — Encounter (HOSPITAL_BASED_OUTPATIENT_CLINIC_OR_DEPARTMENT_OTHER): Payer: Self-pay

## 2022-05-15 DIAGNOSIS — I10 Essential (primary) hypertension: Secondary | ICD-10-CM | POA: Insufficient documentation

## 2022-05-15 DIAGNOSIS — D72829 Elevated white blood cell count, unspecified: Secondary | ICD-10-CM | POA: Diagnosis not present

## 2022-05-15 DIAGNOSIS — R519 Headache, unspecified: Secondary | ICD-10-CM

## 2022-05-15 DIAGNOSIS — Z79899 Other long term (current) drug therapy: Secondary | ICD-10-CM | POA: Diagnosis not present

## 2022-05-15 LAB — BASIC METABOLIC PANEL
Anion gap: 10 (ref 5–15)
BUN: 18 mg/dL (ref 6–20)
CO2: 25 mmol/L (ref 22–32)
Calcium: 9.1 mg/dL (ref 8.9–10.3)
Chloride: 101 mmol/L (ref 98–111)
Creatinine, Ser: 1.09 mg/dL (ref 0.61–1.24)
GFR, Estimated: >60 mL/min (ref >60)
Glucose, Bld: 85 mg/dL (ref 70–99)
Potassium: 4.4 mmol/L (ref 3.5–5.1)
Sodium: 136 mmol/L (ref 135–145)

## 2022-05-15 LAB — CBC WITH DIFFERENTIAL/PLATELET
Abs Immature Granulocytes: 0.03 10*3/uL (ref 0.00–0.07)
Basophils Absolute: 0.1 10*3/uL (ref 0.0–0.1)
Basophils Relative: 1 %
Eosinophils Absolute: 0.2 10*3/uL (ref 0.0–0.5)
Eosinophils Relative: 2 %
HCT: 46 % (ref 39.0–52.0)
Hemoglobin: 15.2 g/dL (ref 13.0–17.0)
Immature Granulocytes: 0 %
Lymphocytes Relative: 24 %
Lymphs Abs: 2.8 10*3/uL (ref 0.7–4.0)
MCH: 28.6 pg (ref 26.0–34.0)
MCHC: 33 g/dL (ref 30.0–36.0)
MCV: 86.5 fL (ref 80.0–100.0)
Monocytes Absolute: 0.9 10*3/uL (ref 0.1–1.0)
Monocytes Relative: 7 %
Neutro Abs: 7.6 10*3/uL (ref 1.7–7.7)
Neutrophils Relative %: 66 %
Platelets: 325 10*3/uL (ref 150–400)
RBC: 5.32 MIL/uL (ref 4.22–5.81)
RDW: 12.8 % (ref 11.5–15.5)
WBC: 11.6 10*3/uL — ABNORMAL HIGH (ref 4.0–10.5)
nRBC: 0 % (ref 0.0–0.2)

## 2022-05-15 MED ORDER — IOHEXOL 350 MG/ML SOLN
100.0000 mL | Freq: Once | INTRAVENOUS | Status: AC | PRN
  Administered 2022-05-15: 100 mL via INTRAVENOUS

## 2022-05-15 MED ORDER — PROCHLORPERAZINE EDISYLATE 10 MG/2ML IJ SOLN
10.0000 mg | Freq: Once | INTRAMUSCULAR | Status: AC
  Administered 2022-05-15: 10 mg via INTRAVENOUS
  Filled 2022-05-15: qty 2

## 2022-05-15 MED ORDER — PROCHLORPERAZINE MALEATE 10 MG PO TABS: 10.0000 mg | ORAL_TABLET | Freq: Two times a day (BID) | ORAL | 0 refills | Status: AC | PRN

## 2022-05-15 MED ORDER — SODIUM CHLORIDE 0.9 % IV BOLUS
1000.0000 mL | Freq: Once | INTRAVENOUS | Status: AC
  Administered 2022-05-15: 1000 mL via INTRAVENOUS

## 2022-05-15 MED ORDER — DIPHENHYDRAMINE HCL 50 MG/ML IJ SOLN
50.0000 mg | Freq: Once | INTRAMUSCULAR | Status: AC
  Administered 2022-05-15: 50 mg via INTRAVENOUS
  Filled 2022-05-15: qty 1

## 2022-05-15 NOTE — ED Notes (Signed)
ED Provider at bedside. 

## 2022-05-15 NOTE — ED Notes (Signed)
D/c paperwork reviewed with pt, including prescriptions and follow up care.  All questions and/or concerns addressed at time of d/c.  No further needs expressed. . Pt verbalized understanding, Ambulatory with family to ED exit, NAD.   

## 2022-05-15 NOTE — ED Triage Notes (Addendum)
Pt ambulatory to triage. Accompanied by family Reports hx of TIA 3 years ago. Presents with Right side temporal pain x 2 days . Reports pain similar to when he had TIA Non complaint with BP meds. No neuro deficits assessed in triage

## 2022-05-15 NOTE — Discharge Instructions (Signed)
Your history, exam, evaluation today was overall reassuring.  Your similar to prior headache that was felt to be stroke led [place] to get the CTA head and neck that was overall reassuring and similar to prior.  No evidence of bleed, aneurysm, or other acute vascular change compared to prior.  Your labs are also similar and after headache cocktail your symptoms resolved.  We feel you are safe for discharge home as this is likely a complex migraine.  We feel safe with you following up with outpatient neurology with good return precautions.  If any symptoms were to change or worsen, please return to the nearest emergency department.

## 2022-05-15 NOTE — ED Notes (Signed)
Pt in imaging

## 2022-05-15 NOTE — ED Provider Notes (Signed)
[place] [facility] Provider Note   CSN: [id number] Arrival date & time: 05/15/22  1616     History  Chief Complaint  Patient presents with   Headache    Craig [name] is a 42 y.o. male.  The history is provided by the patient and medical records. No language interpreter was used.  Headache Pain location:  R temporal Quality:  Dull Radiates to:  R neck Severity currently:  7/10 Severity at highest:  9/10 Onset quality:  Gradual Duration:  3 days Timing:  Constant Progression:  Unchanged Relieved by:  Nothing Worsened by:  Nothing Ineffective treatments:  None tried Associated symptoms: facial pain and neck pain   Associated symptoms: no abdominal pain, no back pain, no blurred vision, no congestion, no cough, no diarrhea, no dizziness, no eye pain, no fatigue, no fever, no loss of balance, no nausea, no neck stiffness, no numbness, no paresthesias, no photophobia, no seizures, no sinus pressure, no syncope, no visual change, no vomiting and no weakness        Home Medications Prior to Admission medications   Medication Sig Start Date End Date Taking? Authorizing Provider  ALPRAZolam Duanne Moron) 0.5 MG tablet Take 1 tablet (0.5 mg total) by mouth 2 (two) times daily. 08/27/20   Metzger-Cihelka, York Cerise, NP  atorvastatin (LIPITOR) 40 MG tablet Take 1 tablet (40 mg total) by mouth daily. 08/28/20   Metzger-Cihelka, York Cerise, NP  escitalopram (LEXAPRO) 20 MG tablet Take 1 tablet (20 mg total) by mouth daily. 08/28/20   Metzger-Cihelka, York Cerise, NP  famotidine (PEPCID) 20 MG tablet Take 1 tablet (20 mg total) by mouth 2 (two) times daily. 08/27/20   Metzger-Cihelka, York Cerise, NP  lisinopril-hydrochlorothiazide (ZESTORETIC) 20-12.5 MG tablet Take 1 tablet by mouth every morning. 07/31/19   [provider]  topiramate (TOPAMAX) 25 MG tablet Take 1 tablet (25 mg total) by mouth at bedtime. 08/27/20   Metzger-Cihelka, York Cerise, NP       Allergies    Patient has no known allergies.    Review of Systems   Review of Systems  Constitutional:  Negative for chills, fatigue and fever.  HENT:  Negative for congestion and sinus pressure.   Eyes:  Negative for blurred vision, photophobia and pain.  Respiratory:  Negative for cough.   Cardiovascular:  Negative for chest pain, palpitations, leg swelling and syncope.  Gastrointestinal:  Negative for abdominal distention, abdominal pain, diarrhea, nausea and vomiting.  Genitourinary:  Negative for flank pain.  Musculoskeletal:  Positive for neck pain. Negative for back pain and neck stiffness.  Skin:  Negative for rash and wound.  Neurological:  Positive for headaches. Negative for dizziness, seizures, syncope, facial asymmetry, speech difficulty, weakness, light-headedness, numbness, paresthesias and loss of balance.  Psychiatric/Behavioral:  Negative for agitation.   All other systems reviewed and are negative.   Physical Exam Updated Vital Signs BP (!) 133/95   Pulse 83   Temp 98.2 F (36.8 C) (Oral)   Ht 5\' 9"  (1.753 m)   Wt (!) 181.4 kg   SpO2 99%   BMI 59.07 kg/m  Physical Exam Vitals and nursing note reviewed.  Constitutional:      General: He is not in acute distress.    Appearance: He is well-developed. He is not ill-appearing, toxic-appearing or diaphoretic.  HENT:     Head: Normocephalic and atraumatic.     Nose: No congestion or rhinorrhea.     Mouth/Throat:     Mouth: Mucous membranes are  moist.  Eyes:     General: No scleral icterus.    Extraocular Movements: Extraocular movements intact.     Right eye: Normal extraocular motion.     Left eye: Normal extraocular motion.     Conjunctiva/sclera: Conjunctivae normal.     Pupils: Pupils are equal, round, and reactive to light. Pupils are equal.  Neck:     Vascular: No carotid bruit.  Cardiovascular:     Rate and Rhythm: Normal rate and regular rhythm.     Heart sounds: No murmur heard. Pulmonary:      Effort: Pulmonary effort is normal. No respiratory distress.     Breath sounds: Normal breath sounds. No wheezing, rhonchi or rales.  Chest:     Chest wall: No tenderness.  Abdominal:     General: Abdomen is flat.     Palpations: Abdomen is soft.     Tenderness: There is no abdominal tenderness. There is no right CVA tenderness, left CVA tenderness, guarding or rebound.  Musculoskeletal:        General: No swelling or tenderness.     Cervical back: Neck supple. No tenderness.     Right lower leg: No edema.     Left lower leg: No edema.  Skin:    General: Skin is warm and dry.     Capillary Refill: Capillary refill takes less than 2 seconds.     Findings: No erythema.  Neurological:     General: No focal deficit present.     Mental Status: He is alert.     Cranial Nerves: No cranial nerve deficit, dysarthria or facial asymmetry.     Sensory: No sensory deficit.     Motor: No weakness.  Psychiatric:        Mood and Affect: Mood normal.     ED Results / Procedures / Treatments   Labs (all labs ordered are listed, but only abnormal results are displayed) Labs Reviewed  CBC WITH DIFFERENTIAL/PLATELET - Abnormal; Notable for the following components:      Result Value   WBC 11.6 (*)    All other components within normal limits  BASIC METABOLIC PANEL    EKG EKG Interpretation  Date/Time:  Sunday May 15 2022 17:25:09 EDT Ventricular Rate:  82 PR Interval:  160 QRS Duration: 95 QT Interval:  368 QTC Calculation: 430 R Axis:   155 Text Interpretation: Sinus rhythm Left posterior fascicular block when compared to prior,  now sinus rhyth, No STEMI Confirmed by Antony Blackbird [phone]) on 05/15/2022 5:30:15 PM  Radiology CT ANGIO HEAD NECK W WO CM  Result Date: 05/15/2022 CLINICAL DATA:  Headache EXAM: CT ANGIOGRAPHY HEAD AND NECK TECHNIQUE: Multidetector CT imaging of the head and neck was performed using the standard protocol during bolus administration of intravenous  contrast. Multiplanar CT image reconstructions and MIPs were obtained to evaluate the vascular anatomy. Carotid stenosis measurements (when applicable) are obtained utilizing NASCET criteria, using the distal internal carotid diameter as the denominator. RADIATION DOSE REDUCTION: This exam was performed according to the departmental dose-optimization program which includes automated exposure control, adjustment of the mA and/or kV according to patient size and/or use of iterative reconstruction technique. CONTRAST:  166mL OMNIPAQUE IOHEXOL 350 MG/ML SOLN COMPARISON:  CT head 08/25/2020, MRI head 08/26/2020, CT head 04/12/2018 FINDINGS: CT HEAD FINDINGS Brain: No evidence of acute infarction, hemorrhage, hydrocephalus, extra-axial collection or mass lesion/mass effect. Vascular: No hyperdense vessel or unexpected calcification. Skull: Normal. Negative for fracture or focal lesion. Sinuses/Orbits: Small right  mastoid effusion. No middle ear effusion. Paranasal sinuses are notable for mucosal thickening in the anterior right ethmoid air cells and in the right frontal sinus. Orbits are unremarkable Other: None Review of the MIP images confirms the above findings CTA NECK FINDINGS Aortic arch: Standard branching. Imaged portion shows no evidence of aneurysm or dissection. No significant stenosis of the major arch vessel origins. Right carotid system: No evidence of dissection, stenosis (50% or greater), or occlusion. Left carotid system: No evidence of dissection, stenosis (50% or greater), or occlusion. Vertebral arteries: Right dominant. The left vertebral artery is diffusely small in caliber, unchanged compared to 2022. No evidence of dissection, stenosis (50% or greater), or occlusion. Skeleton: Negative. Other neck: Negative. Upper chest: Negative. Review of the MIP images confirms the above findings CTA HEAD FINDINGS Anterior circulation: No significant stenosis, proximal occlusion, aneurysm, or vascular  malformation. Posterior circulation: No significant stenosis, proximal occlusion, aneurysm, or vascular malformation. Venous sinuses: As permitted by contrast timing, patent. Anatomic variants: None Review of the MIP images confirms the above findings IMPRESSION: 1. No acute intracranial abnormality. 2. No intracranial large vessel occlusion or significant stenosis. 3. No hemodynamically significant stenosis in the neck. Small left vertebral artery with focal areas of poor opacification in the V4 segment, unchanged compared to 2022. 4. Small right mastoid effusion. Electronically Signed   By: Marin Roberts M.D.   On: 05/15/2022 19:12    Procedures Procedures    Medications Ordered in ED Medications  sodium chloride 0.9 % bolus 1,000 mL (0 mLs Intravenous Stopped 05/15/22 1833)  prochlorperazine (COMPAZINE) injection 10 mg (10 mg Intravenous Given 05/15/22 1729)  diphenhydrAMINE (BENADRYL) injection 50 mg (50 mg Intravenous Given 05/15/22 1728)  iohexol (OMNIPAQUE) 350 MG/ML injection 100 mL (100 mLs Intravenous Contrast Given 05/15/22 1840)    ED Course/ Medical Decision Making/ A&P                             Medical Decision Making Amount and/or Complexity of Data Reviewed Labs: ordered. Radiology: ordered.  Risk Prescription drug management.    Craig [name] is a 42 y.o. male with a past medical history significant for hypertension, gout, anxiety, and previous stroke and received tPA presents with 3 days of right-sided headache.  According to patient, the headache is what preceded his previous stroke and he wants to make sure he does not have progression again.  He says that he had no head trauma but has had some headache developed over the last 3 days on his right side of his face and head and neck.  He said last time ended up with facial droop, right-sided weakness, and some vision changes.  He ended up getting tPA and admitted.  MRI at that time did not show evidence of acute stroke  and is unclear if there was truly stroke versus complex migraine or other cause of symptoms.  He reports no recent fevers, chills, congestion, cough, nausea, vomiting, constipation, diarrhea, or urinary changes.  Denies any current vision changes or speech troubles.  Denies any weakness on the right side like last time.  He had workup that was not convincing for temporal arteritis and he reports his right temple area is having some mild discomfort there with no rashes.  No shingles appearing on the face.  He reports some mild soreness in his neck but otherwise no neck stiffness.  No history of meningitis and no other infectious symptoms at this  time.  He reports the pain is moderate to severe and he wants to prevent things worsening.  On exam, lungs clear.  Chest nontender.  Abdomen nontender.  Patient has intact sensation, strength, and pulse in extremities.  Normal finger-nose-finger testing.  Symmetric smile.  Clear speech.  Pupils symmetric and reactive with normal extraocular movements.  Mild tenderness in the right temple area but no other abnormalities on exam.  Neck was nontender and was not stiff.  No carotid bruit on my exam.  Vital signs reassuring initially.  I had a shared decision conversation with patient.  Will give a headache cocktail initially and get some labs and a CT of the head neck to rule out AN arterial or vascular problem such as vertebral artery dissection.  Anticipate discussion with neurology if his symptoms improve and workup is reassuring to discuss outpatient management.  Given his lack of any neurologic deficits, have low suspicion for acute stroke at this time.  Anticipate reassessment.  Patient reports complete resolution of his headache and still has no neurologic deficits.  Given his reassuring workup, feel he is safe for discharge home.  No evidence of LVO, dissection, or bleed.  No tenderness on the face now and still has a negative neurologic exam.  We discussed that  his white count appears to be always elevated but is improved from prior and rest of workup is reassuring.  Patient will follow-up with outpatient neurology and will give prescription for Compazine as this helped him today.  We discussed Benadryl use with it.  He to the questions or concerns and was discharged in good condition.         Final Clinical Impression(s) / ED Diagnoses Final diagnoses:  Bad headache    Rx / DC Orders ED Discharge Orders          Ordered    prochlorperazine (COMPAZINE) 10 MG tablet  2 times daily PRN        05/15/22 2033           Clinical Impression: 1. Bad headache     Disposition: Discharge  Condition: Good  I have discussed the results, Dx and Tx plan with the pt(& family if present). He/she/they expressed understanding and agree(s) with the plan. Discharge instructions discussed at great length. Strict return precautions discussed and pt &/or family have verbalized understanding of the instructions. No further questions at time of discharge.    New Prescriptions   PROCHLORPERAZINE (COMPAZINE) 10 MG TABLET    Take 1 tablet (10 mg total) by mouth 2 (two) times daily as needed for nausea or vomiting.    Follow Up: [facility] [address]     [place] [phone] [facility] [address] [id number] [place] [phone]       Mishell Donalson, Gwenyth Allegra, MD 05/15/22 2034

## 2022-09-03 IMAGING — CT CT HEAD W/O CM
4 of 5 series · 15 of 47 positions shown, 17 images · non-contrast
Comparison: None.

CLINICAL DATA: right sided blurred vision hx of stroke similar
presentation



[Series 3: head without · axial · non-contrast · 0.45mm/px · z∈[+396,+501]mm · 4 of 37 slices shown]
[im 8/37  brain]
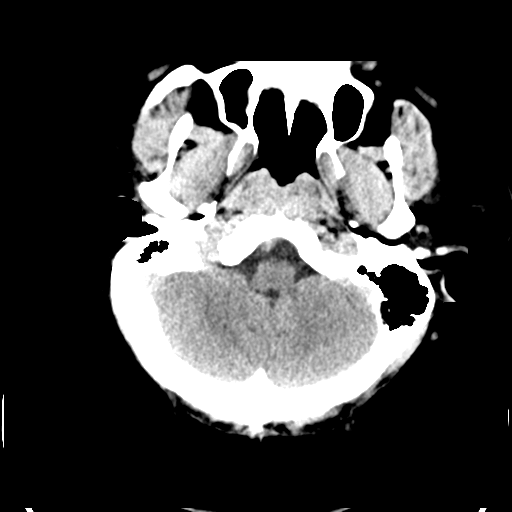
[im 15/37  brain]
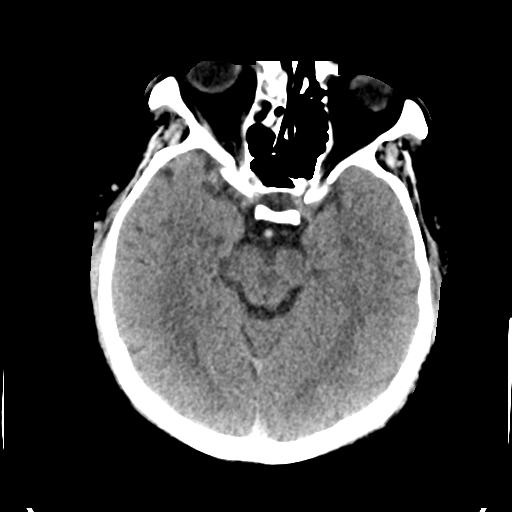
[im 22/37  brain]
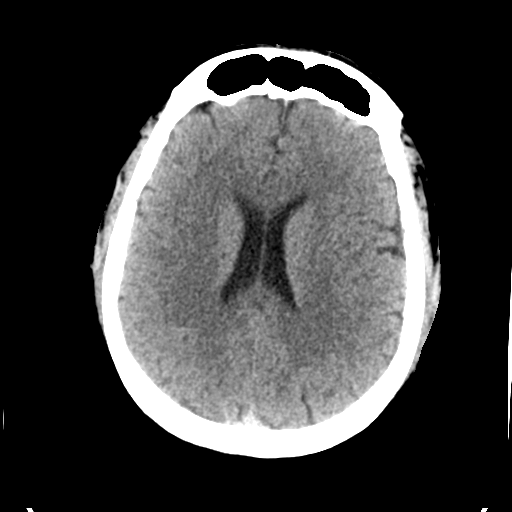
[im 29/37  brain]
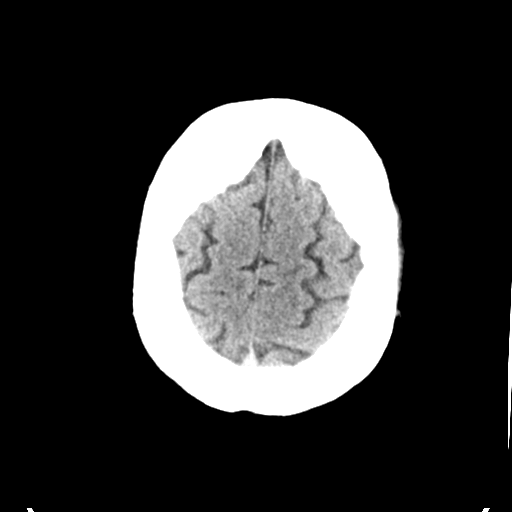

[Series 5: head without cor · coronal · non-contrast · 0.36mm/px · 3 of 72 slices shown]
[im 24/72  brain]
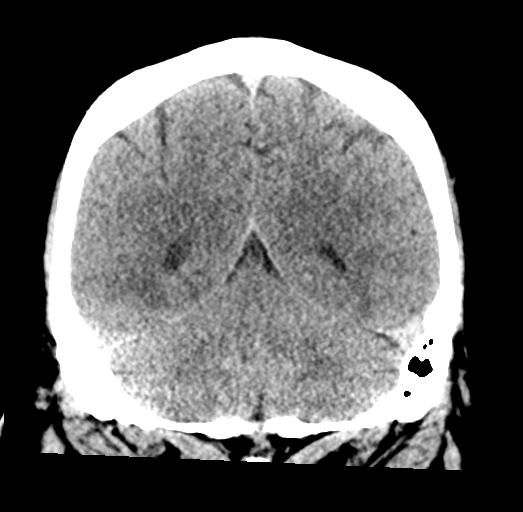
[im 32/72  brain]
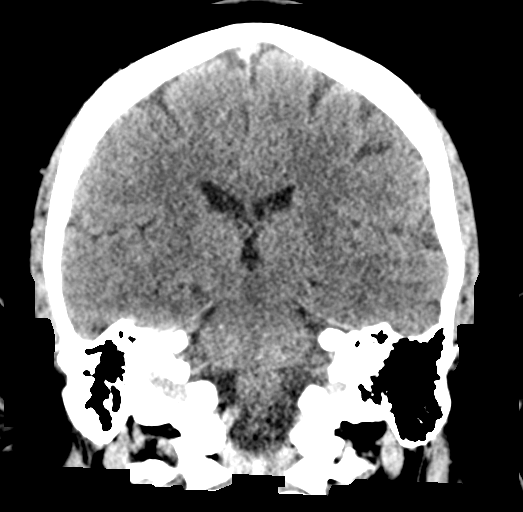
[im 40/72  brain]
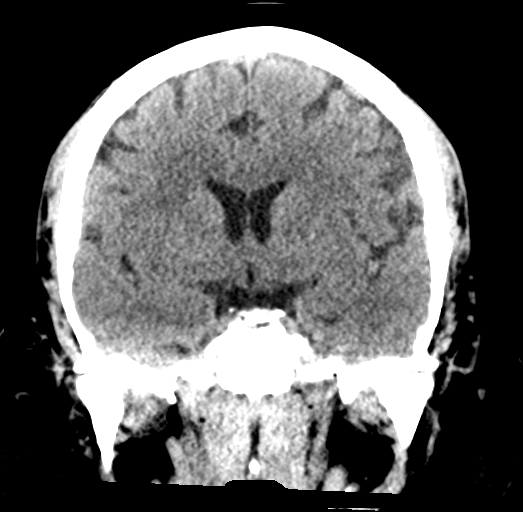

[Series 6: head without sag · sagittal · non-contrast · 0.36mm/px · 3 of 59 slices shown]
[im 20/59  brain]
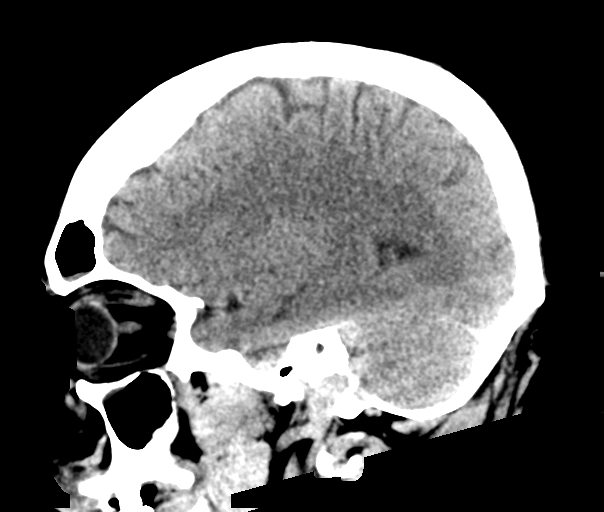
[im 30/59  brain]
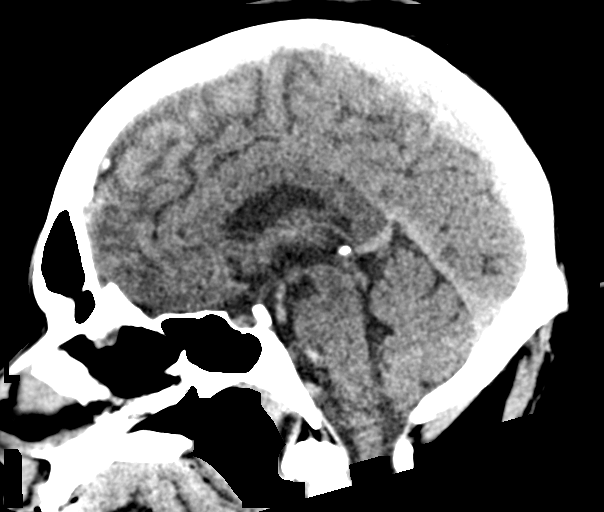
[im 39/59  brain]
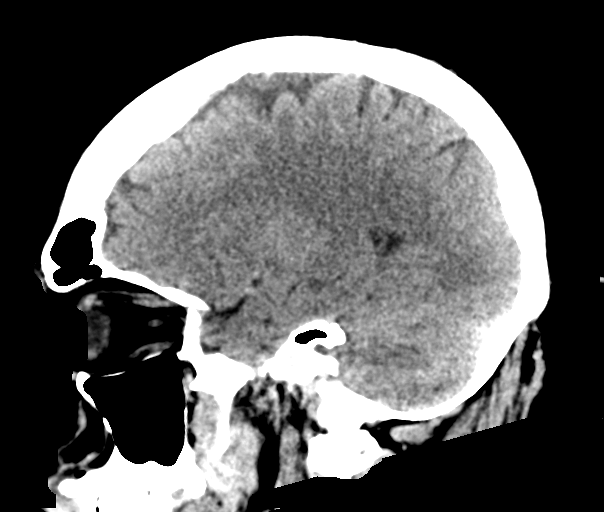

[Series 7: head without ax · axial · non-contrast · 0.36mm/px · z∈[+404,+521]mm · 5 of 37 slices shown, 7 images]
[im 7/37  brain]
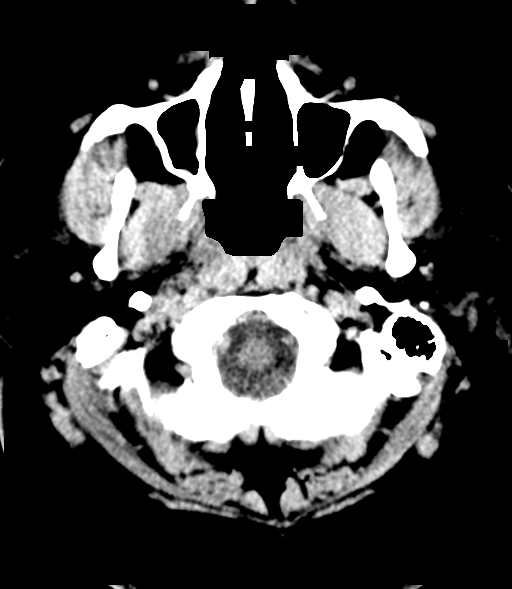
[im 7/37  bone]
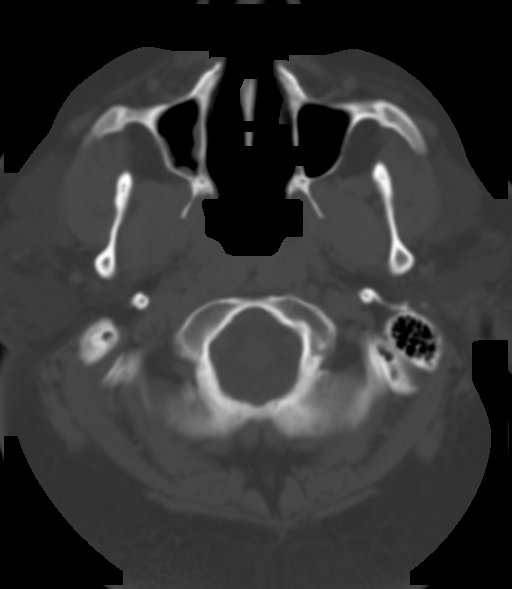
[im 13/37  brain]
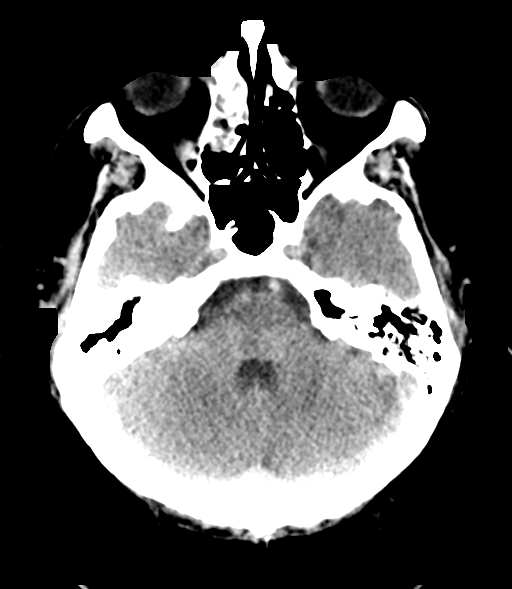
[im 19/37  brain]
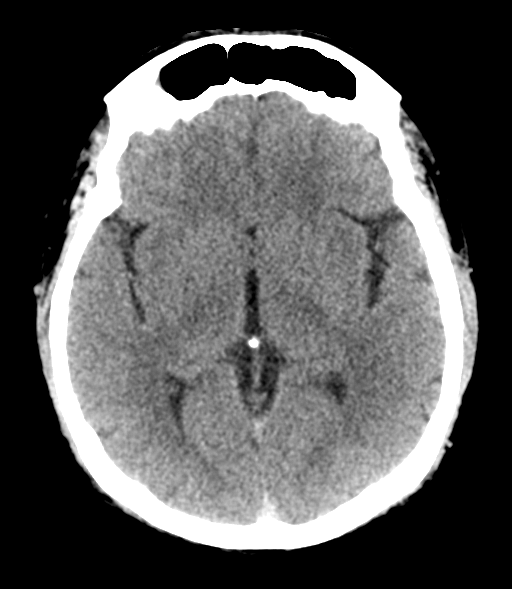
[im 25/37  brain]
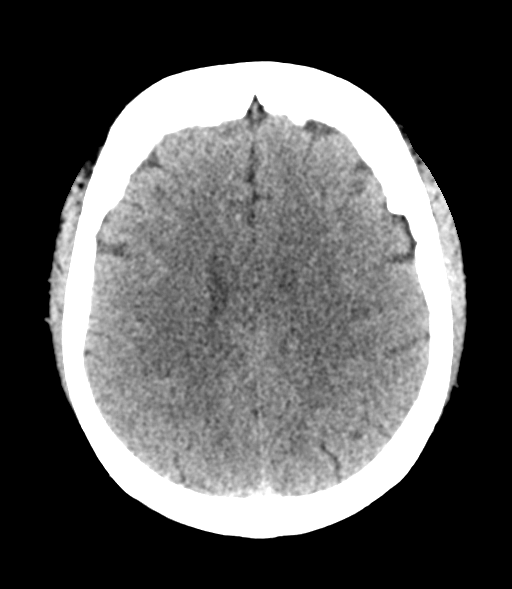
[im 31/37  brain]
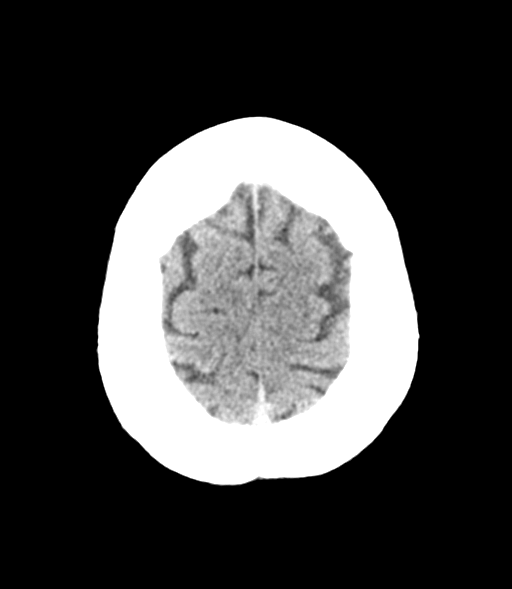
[im 31/37  bone]
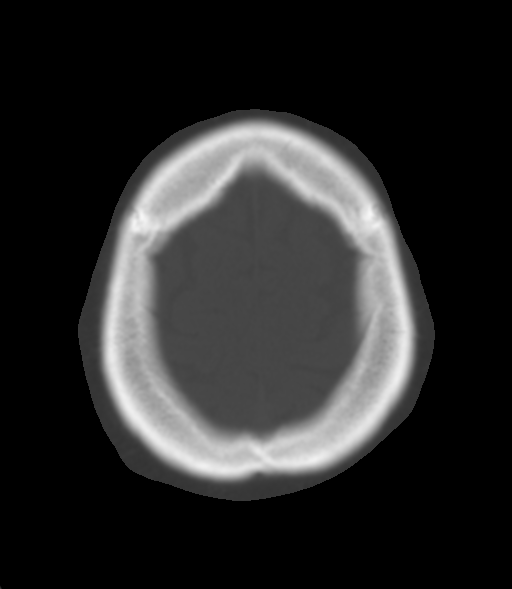

[15 of 47 positions shown; findings below may reference images not displayed]

FINDINGS: Brain:

No evidence of large-territorial acute infarction. No parenchymal
hemorrhage. No mass lesion. No extra-axial collection.

No mass effect or midline shift. No hydrocephalus. Basilar cisterns
are patent.

Vascular: No hyperdense vessel.

Skull: No acute fracture or focal lesion.

Sinuses/Orbits: Right ethmoid and maxillary mucosal thickening.
Paranasal sinuses and mastoid air cells are clear. The orbits are
unremarkable.

Other: None.
IMPRESSION: No acute intracranial abnormality.

## 2023-03-07 ENCOUNTER — Encounter (HOSPITAL_BASED_OUTPATIENT_CLINIC_OR_DEPARTMENT_OTHER): Payer: Self-pay

## 2023-03-07 ENCOUNTER — Emergency Department (HOSPITAL_BASED_OUTPATIENT_CLINIC_OR_DEPARTMENT_OTHER)
Admission: EM | Admit: 2023-03-07 | Discharge: 2023-03-07 | Disposition: A | Discharge status: Home / Self Care | Payer: Medicaid Other | Attending: Emergency Medicine | Admitting: Emergency Medicine

## 2023-03-07 ENCOUNTER — Other Ambulatory Visit: Payer: Self-pay

## 2023-03-07 DIAGNOSIS — J069 Acute upper respiratory infection, unspecified: Secondary | ICD-10-CM | POA: Insufficient documentation

## 2023-03-07 DIAGNOSIS — R059 Cough, unspecified: Secondary | ICD-10-CM | POA: Diagnosis present

## 2023-03-07 DIAGNOSIS — Z20822 Contact with and (suspected) exposure to covid-19: Secondary | ICD-10-CM | POA: Insufficient documentation

## 2023-03-07 DIAGNOSIS — Z79899 Other long term (current) drug therapy: Secondary | ICD-10-CM | POA: Diagnosis not present

## 2023-03-07 LAB — GROUP A STREP BY PCR: Group A Strep by PCR: NOT DETECTED

## 2023-03-07 LAB — RESP PANEL BY RT-PCR (RSV, FLU A&B, COVID)  RVPGX2
Influenza A by PCR: NEGATIVE
Influenza B by PCR: NEGATIVE
Resp Syncytial Virus by PCR: NEGATIVE
SARS Coronavirus 2 by RT PCR: NEGATIVE

## 2023-03-07 MED ORDER — PENICILLIN G BENZATHINE 1200000 UNIT/2ML IM SUSY
1.2000 10*6.[IU] | PREFILLED_SYRINGE | Freq: Once | INTRAMUSCULAR | Status: AC
Start: 2023-03-07 — End: 2023-03-07
  Administered 2023-03-07: 1.2 10*6.[IU] via INTRAMUSCULAR
  Filled 2023-03-07: qty 2

## 2023-03-07 NOTE — ED Triage Notes (Addendum)
Coughing x 4-5 days. Non productive. Irritated throat Denies fever . Difficulty breathing with coughing

## 2023-03-07 NOTE — ED Notes (Signed)
Reviewed discharge instructions, recommendations and follow up with pt. Pt states understanding. No adverse reaction from ABT

## 2023-03-07 NOTE — ED Provider Notes (Signed)
[place] [facility] Provider Note   CSN: [id number] Arrival date & time: 03/07/23  1051     History  Chief Complaint  Patient presents with   Cough    Craig [name] is a 43 y.o. male.  Patient here with cough and sore throat.  Sick contacts at home.  Nothing makes it worse or better.  History of high cholesterol.  Denies any chest pain shortness of breath weakness numbness tingling.  Has had ongoing cough but fever resolved now.  Denies any issues other than chronic cough.  The history is provided by the patient.       Home Medications Prior to Admission medications   Medication Sig Start Date End Date Taking? Authorizing Provider  ALPRAZolam Prudy Feeler) 0.5 MG tablet Take 1 tablet (0.5 mg total) by mouth 2 (two) times daily. 08/27/20   Metzger-Cihelka, Cristie Hem, NP  atorvastatin (LIPITOR) 40 MG tablet Take 1 tablet (40 mg total) by mouth daily. 08/28/20   Metzger-Cihelka, Cristie Hem, NP  escitalopram (LEXAPRO) 20 MG tablet Take 1 tablet (20 mg total) by mouth daily. 08/28/20   Metzger-Cihelka, Cristie Hem, NP  famotidine (PEPCID) 20 MG tablet Take 1 tablet (20 mg total) by mouth 2 (two) times daily. 08/27/20   Metzger-Cihelka, Cristie Hem, NP  lisinopril-hydrochlorothiazide (ZESTORETIC) 20-12.5 MG tablet Take 1 tablet by mouth every morning. 07/31/19   [provider]  prochlorperazine (COMPAZINE) 10 MG tablet Take 1 tablet (10 mg total) by mouth 2 (two) times daily as needed for nausea or vomiting. 05/15/22   Tegeler, Canary Brim, MD  topiramate (TOPAMAX) 25 MG tablet Take 1 tablet (25 mg total) by mouth at bedtime. 08/27/20   Metzger-Cihelka, Cristie Hem, NP      Allergies    Patient has no known allergies.    Review of Systems   Review of Systems  Physical Exam Updated Vital Signs BP (!) 145/104 (BP Location: Left Wrist)   Pulse 76   Temp 98.2 F (36.8 C) (Oral)   Resp 18   Wt (!) 176.9 kg   SpO2 99%   BMI 57.59 kg/m  Physical  Exam Vitals and nursing note reviewed.  Constitutional:      General: He is not in acute distress.    Appearance: He is well-developed. He is not ill-appearing.  HENT:     Head: Normocephalic and atraumatic.     Nose: Nose normal.     Mouth/Throat:     Mouth: Mucous membranes are moist.  Eyes:     Extraocular Movements: Extraocular movements intact.     Conjunctiva/sclera: Conjunctivae normal.     Pupils: Pupils are equal, round, and reactive to light.  Cardiovascular:     Rate and Rhythm: Normal rate and regular rhythm.     Pulses: Normal pulses.     Heart sounds: Normal heart sounds. No murmur heard. Pulmonary:     Effort: Pulmonary effort is normal. No respiratory distress.     Breath sounds: Normal breath sounds.  Abdominal:     Palpations: Abdomen is soft.     Tenderness: There is no abdominal tenderness.  Musculoskeletal:        General: No swelling.     Cervical back: Normal range of motion and neck supple.  Skin:    General: Skin is warm and dry.     Capillary Refill: Capillary refill takes less than 2 seconds.  Neurological:     General: No focal deficit present.     Mental Status: He  is alert.  Psychiatric:        Mood and Affect: Mood normal.     ED Results / Procedures / Treatments   Labs (all labs ordered are listed, but only abnormal results are displayed) Labs Reviewed  RESP PANEL BY RT-PCR (RSV, FLU A&B, COVID)  [id number]  GROUP A STREP BY PCR    EKG None  Radiology No results found.  Procedures Procedures    Medications Ordered in ED Medications  penicillin g benzathine (BICILLIN LA) 1200000 UNIT/2ML injection 1.2 Million Units (1.2 Million Units Intramuscular Given 03/07/23 1220)    ED Course/ Medical Decision Making/ A&P                                 Medical Decision Making Risk Prescription drug management.   Craig [name] is here for cough and sore throat.  Sick contacts in family.  Children with flu, strep throat.  His COVID  flu strep test negative but given his symptoms we will treat with penicillin.  Seems like he was sick for so wonder if his flu test has may be false negative at this point he is having postviral cough.  No major sign of throat abscess or throat process on exam.  Is got clear breath sounds.  He is well-appearing.  Overall treat him with penicillin for strep and I think he has resolving viral process.  Discharged in good condition.  Have no concern for emergent process or pneumonia at this time.  He understands return precautions.  Vital signs are normal.  This chart was dictated using voice recognition software.  Despite best efforts to proofread,  errors can occur which can change the documentation meaning.         Final Clinical Impression(s) / ED Diagnoses Final diagnoses:  Viral URI with cough    Rx / DC Orders ED Discharge Orders     None         Virgina Norfolk, DO 03/07/23 1235

## 2023-03-15 ENCOUNTER — Other Ambulatory Visit: Payer: Self-pay

## 2023-03-15 ENCOUNTER — Encounter (HOSPITAL_BASED_OUTPATIENT_CLINIC_OR_DEPARTMENT_OTHER): Payer: Self-pay

## 2023-03-15 ENCOUNTER — Emergency Department (HOSPITAL_BASED_OUTPATIENT_CLINIC_OR_DEPARTMENT_OTHER): Payer: Medicaid Other

## 2023-03-15 ENCOUNTER — Emergency Department (HOSPITAL_BASED_OUTPATIENT_CLINIC_OR_DEPARTMENT_OTHER)
Admission: EM | Admit: 2023-03-15 | Discharge: 2023-03-16 | Disposition: A | Discharge status: Home / Self Care | Payer: Medicaid Other | Attending: Emergency Medicine | Admitting: Emergency Medicine

## 2023-03-15 DIAGNOSIS — Z79899 Other long term (current) drug therapy: Secondary | ICD-10-CM | POA: Diagnosis not present

## 2023-03-15 DIAGNOSIS — R0602 Shortness of breath: Secondary | ICD-10-CM | POA: Insufficient documentation

## 2023-03-15 DIAGNOSIS — R058 Other specified cough: Secondary | ICD-10-CM | POA: Diagnosis present

## 2023-03-15 DIAGNOSIS — I10 Essential (primary) hypertension: Secondary | ICD-10-CM | POA: Insufficient documentation

## 2023-03-15 LAB — CBC WITH DIFFERENTIAL/PLATELET
Abs Immature Granulocytes: 0.04 10*3/uL (ref 0.00–0.07)
Basophils Absolute: 0.1 10*3/uL (ref 0.0–0.1)
Basophils Relative: 1 %
Eosinophils Absolute: 0.2 10*3/uL (ref 0.0–0.5)
Eosinophils Relative: 2 %
HCT: 46.4 % (ref 39.0–52.0)
Hemoglobin: 15.4 g/dL (ref 13.0–17.0)
Immature Granulocytes: 0 %
Lymphocytes Relative: 30 %
Lymphs Abs: 3.1 10*3/uL (ref 0.7–4.0)
MCH: 28.3 pg (ref 26.0–34.0)
MCHC: 33.2 g/dL (ref 30.0–36.0)
MCV: 85.3 fL (ref 80.0–100.0)
Monocytes Absolute: 0.5 10*3/uL (ref 0.1–1.0)
Monocytes Relative: 5 %
Neutro Abs: 6.2 10*3/uL (ref 1.7–7.7)
Neutrophils Relative %: 62 %
Platelets: 392 10*3/uL (ref 150–400)
RBC: 5.44 MIL/uL (ref 4.22–5.81)
RDW: 13.1 % (ref 11.5–15.5)
WBC: 10.1 10*3/uL (ref 4.0–10.5)
nRBC: 0 % (ref 0.0–0.2)

## 2023-03-15 LAB — COMPREHENSIVE METABOLIC PANEL
ALT: 29 U/L (ref 0–44)
AST: 19 U/L (ref 15–41)
Albumin: 4 g/dL (ref 3.5–5.0)
Alkaline Phosphatase: 77 U/L (ref 38–126)
Anion gap: 11 (ref 5–15)
BUN: 13 mg/dL (ref 6–20)
CO2: 20 mmol/L — ABNORMAL LOW (ref 22–32)
Calcium: 8.7 mg/dL — ABNORMAL LOW (ref 8.9–10.3)
Chloride: 103 mmol/L (ref 98–111)
Creatinine, Ser: 0.94 mg/dL (ref 0.61–1.24)
GFR, Estimated: >60 mL/min (ref >60)
Glucose, Bld: 109 mg/dL — ABNORMAL HIGH (ref 70–99)
Potassium: 3.8 mmol/L (ref 3.5–5.1)
Sodium: 134 mmol/L — ABNORMAL LOW (ref 135–145)
Total Bilirubin: 0.8 mg/dL (ref 0.0–1.2)
Total Protein: 8 g/dL (ref 6.5–8.1)

## 2023-03-15 LAB — GROUP A STREP BY PCR: Group A Strep by PCR: NOT DETECTED

## 2023-03-15 NOTE — ED Provider Notes (Signed)
[place] [facility] AT [facility] Provider Note   CSN: [id number] Arrival date & time: 03/15/23  2006     History {Add pertinent medical, surgical, social history, OB history to HPI:1} Chief Complaint  Patient presents with   Shortness of Breath    Craig [name] is a 43 y.o. male.  Patient with a history of hypertension, obesity, sleep apnea who does not yet have CPAP presenting with difficulty breathing and coughing.  States he was diagnosed with the flu about a week ago and his cough is persistent causing shortness of breath.  It is nonproductive.  Some chest tightness with coughing only.  Believes he is wheezing.  No runny nose, sore throat, headache, fever, abdominal pain, nausea or vomiting.  No leg pain or leg swelling.  Denies any cardiac or pulmonary history.  No history of COPD or asthma.  Does not smoke.  States he does have sleep apnea but does not yet have a CPAP machine.  Denies any fever.  He was seen in the ED on January 21 and his flu swab was in fact negative but he was still thought to have flu.  He was also given penicillin for possible strep the strep test was negative as well.  The history is provided by the patient.  Shortness of Breath Associated symptoms: cough   Associated symptoms: no abdominal pain, no chest pain, no fever, no headaches, no rash and no vomiting        Home Medications Prior to Admission medications   Medication Sig Start Date End Date Taking? Authorizing Provider  ALPRAZolam Prudy Feeler) 0.5 MG tablet Take 1 tablet (0.5 mg total) by mouth 2 (two) times daily. 08/27/20   Metzger-Cihelka, Cristie Hem, NP  atorvastatin (LIPITOR) 40 MG tablet Take 1 tablet (40 mg total) by mouth daily. 08/28/20   Metzger-Cihelka, Cristie Hem, NP  escitalopram (LEXAPRO) 20 MG tablet Take 1 tablet (20 mg total) by mouth daily. 08/28/20   Metzger-Cihelka, Cristie Hem, NP  famotidine (PEPCID) 20 MG tablet Take 1 tablet (20 mg total) by mouth 2 (two) times  daily. 08/27/20   Metzger-Cihelka, Cristie Hem, NP  lisinopril-hydrochlorothiazide (ZESTORETIC) 20-12.5 MG tablet Take 1 tablet by mouth every morning. 07/31/19   [provider]  prochlorperazine (COMPAZINE) 10 MG tablet Take 1 tablet (10 mg total) by mouth 2 (two) times daily as needed for nausea or vomiting. 05/15/22   Tegeler, Canary Brim, MD  topiramate (TOPAMAX) 25 MG tablet Take 1 tablet (25 mg total) by mouth at bedtime. 08/27/20   Metzger-Cihelka, Cristie Hem, NP      Allergies    Patient has no known allergies.    Review of Systems   Review of Systems  Constitutional:  Positive for fatigue. Negative for activity change, appetite change and fever.  HENT:  Negative for congestion and rhinorrhea.   Respiratory:  Positive for cough, chest tightness and shortness of breath.   Cardiovascular:  Negative for chest pain.  Gastrointestinal:  Negative for abdominal pain, nausea and vomiting.  Genitourinary:  Negative for dysuria and hematuria.  Musculoskeletal:  Negative for arthralgias and myalgias.  Skin:  Negative for rash.  Neurological:  Negative for dizziness, weakness and headaches.   all other systems are negative except as noted in the HPI and PMH.    Physical Exam Updated Vital Signs BP (!) 169/124 (BP Location: Left Arm)   Pulse 90   Temp 98.3 F (36.8 C)   Resp 16   Ht 5\' 9"  (1.753 m)  Wt (!) 176.9 kg   SpO2 100%   BMI 57.59 kg/m  Physical Exam Vitals and nursing note reviewed.  Constitutional:      General: He is not in acute distress.    Appearance: He is well-developed. He is obese.     Comments: No distress, speaking full sentences  HENT:     Head: Normocephalic and atraumatic.     Mouth/Throat:     Pharynx: No oropharyngeal exudate.  Eyes:     Conjunctiva/sclera: Conjunctivae normal.     Pupils: Pupils are equal, round, and reactive to light.  Neck:     Comments: No meningismus. Cardiovascular:     Rate and Rhythm: Normal rate and regular rhythm.      Heart sounds: Normal heart sounds. No murmur heard. Pulmonary:     Effort: Pulmonary effort is normal. No respiratory distress.     Breath sounds: Normal breath sounds. No wheezing.     Comments: Good air exchange, no wheezing Abdominal:     Palpations: Abdomen is soft.     Tenderness: There is no abdominal tenderness. There is no guarding or rebound.  Musculoskeletal:        General: No tenderness. Normal range of motion.     Cervical back: Normal range of motion and neck supple.  Skin:    General: Skin is warm.  Neurological:     Mental Status: He is alert and oriented to person, place, and time.     Cranial Nerves: No cranial nerve deficit.     Motor: No abnormal muscle tone.     Coordination: Coordination normal.     Comments: No ataxia on finger to nose bilaterally. No pronator drift. 5/5 strength throughout. CN 2-12 intact.Equal grip strength. Sensation intact.   Psychiatric:        Behavior: Behavior normal.     ED Results / Procedures / Treatments   Labs (all labs ordered are listed, but only abnormal results are displayed) Labs Reviewed  COMPREHENSIVE METABOLIC PANEL - Abnormal; Notable for the following components:      Result Value   Sodium 134 (*)    CO2 20 (*)    Glucose, Bld 109 (*)    Calcium 8.7 (*)    All other components within normal limits  GROUP A STREP BY PCR  CBC WITH DIFFERENTIAL/PLATELET  D-DIMER, QUANTITATIVE  BRAIN NATRIURETIC PEPTIDE  TROPONIN I (HIGH SENSITIVITY)    EKG None  Radiology DG Chest 2 View Result Date: 03/15/2023 CLINICAL DATA:  Shortness of breath and cough with recent flu diagnosis, initial encounter EXAM: CHEST - 2 VIEW COMPARISON:  01/04/2020 FINDINGS: The heart size and mediastinal contours are within normal limits. Both lungs are clear. The visualized skeletal structures are unremarkable. IMPRESSION: No active cardiopulmonary disease. Electronically Signed   By: Alcide Clever M.D.   On: 03/15/2023 22:13     Procedures Procedures  {Document cardiac monitor, telemetry assessment procedure when appropriate:1}  Medications Ordered in ED Medications - No data to display  ED Course/ Medical Decision Making/ A&P   {   Click here for ABCD2, HEART and other calculatorsREFRESH Note before signing :1}                              Medical Decision Making Amount and/or Complexity of Data Reviewed Labs: ordered. Decision-making details documented in ED Course. Radiology: ordered and independent interpretation performed. Decision-making details documented in ED Course. ECG/medicine tests: ordered  and independent interpretation performed. Decision-making details documented in ED Course.  Persistent shortness of breath and cough after recent flu diagnosis.  He is not in any distress.  Lungs are clear.  No hypoxia or increased work of breathing.  EKG is sinus rhythm without acute ST changes.  Chest x-ray is clear.  No infiltrate.  Results reviewed and interpreted by me.   Suspect likely postviral cough.  Low suspicion for ACS, CHF, COPD exacerbation, pneumothorax.  Given patient's sedentary lifestyle and obesity will rule out pulmonary embolism.  {Document critical care time when appropriate:1} {Document review of labs and clinical decision tools ie heart score, Chads2Vasc2 etc:1}  {Document your independent review of radiology images, and any outside records:1} {Document your discussion with family members, caretakers, and with consultants:1} {Document social determinants of health affecting pt's care:1} {Document your decision making why or why not admission, treatments were needed:1} Final Clinical Impression(s) / ED Diagnoses Final diagnoses:  None    Rx / DC Orders ED Discharge Orders     None

## 2023-03-15 NOTE — ED Triage Notes (Signed)
Pt was diagnosed with the flu a week ago and now he states he is having increased SHOB & wheezing

## 2023-03-16 LAB — BRAIN NATRIURETIC PEPTIDE: B Natriuretic Peptide: 16.3 pg/mL (ref 0.0–100.0)

## 2023-03-16 LAB — D-DIMER, QUANTITATIVE: D-Dimer, Quant: 0.28 ug{FEU}/mL (ref 0.00–0.50)

## 2023-03-16 LAB — TROPONIN I (HIGH SENSITIVITY): Troponin I (High Sensitivity): 4 ng/L (ref <18)

## 2023-03-16 MED ORDER — HYDROCOD POLI-CHLORPHE POLI ER 10-8 MG/5ML PO SUER
5.0000 mL | Freq: Once | ORAL | Status: AC
  Administered 2023-03-16: 5 mL via ORAL
  Filled 2023-03-16: qty 5

## 2023-03-16 MED ORDER — BENZONATATE 100 MG PO CAPS: 100.0000 mg | ORAL_CAPSULE | Freq: Three times a day (TID) | ORAL | 0 refills | Status: AC

## 2023-03-16 NOTE — Discharge Instructions (Addendum)
Your testing is reassuring.  No evidence of pneumonia, blood clot in the lung, heart failure or heart attack.  Take the cough medication as prescribed.  Follow-up with your primary doctor for further evaluation.  You should obtain a CPAP machine as soon as possible.  Return to the ED with exertional chest pain, shortness of breath, nausea, vomiting or any concerns
